# Patient Record
Sex: Male | Born: 1950 | Race: White | Hispanic: No | Marital: Married | Smoking: Former smoker
Health system: Southern US, Community
[De-identification: ages and names within clinical notes are randomized; demographics above are authoritative.]

## PROBLEM LIST (undated history)

## (undated) DIAGNOSIS — I10 Essential (primary) hypertension: Secondary | ICD-10-CM

## (undated) DIAGNOSIS — I4892 Unspecified atrial flutter: Secondary | ICD-10-CM

## (undated) HISTORY — PX: CYST EXCISION: SHX5701

## (undated) HISTORY — PX: TONSILLECTOMY: SUR1361

---

## 1997-12-24 ENCOUNTER — Ambulatory Visit (HOSPITAL_COMMUNITY): Admission: RE | Admit: 1997-12-24 | Discharge: 1997-12-24 | Payer: Self-pay | Admitting: Family Medicine

## 2006-09-18 ENCOUNTER — Encounter: Admission: RE | Admit: 2006-09-18 | Discharge: 2006-09-18 | Payer: Self-pay | Admitting: Family Medicine

## 2006-10-09 ENCOUNTER — Encounter: Admission: RE | Admit: 2006-10-09 | Discharge: 2006-10-09 | Payer: Self-pay | Admitting: Family Medicine

## 2009-06-07 IMAGING — CT CT CHEST W/ CM
2 of 4 series · 15 of 36 positions shown, 18 images · IV contrast (75CC OMNI 300)
Comparison: GI-[REDACTED] esophagram 09/18/06.

CLINICAL DATA: Dysphagia with posterior esophageal mass effect consistent with right aortic arch on GI-[REDACTED] esophagram, 09/18/06.
 CT CHEST WITH CONTRAST:
TECHNIQUE: Multidetector CT imaging of the chest was performed following the standard protocol during bolus administration of intravenous contrast.
 Contrast:  75 cc Omnipaque 300

[Series 3: routine chest · axial · 0.70mm/px · z∈[-317,-17]mm · 12 of 70 slices shown, 15 images]
[im 5/70  mediastinal]
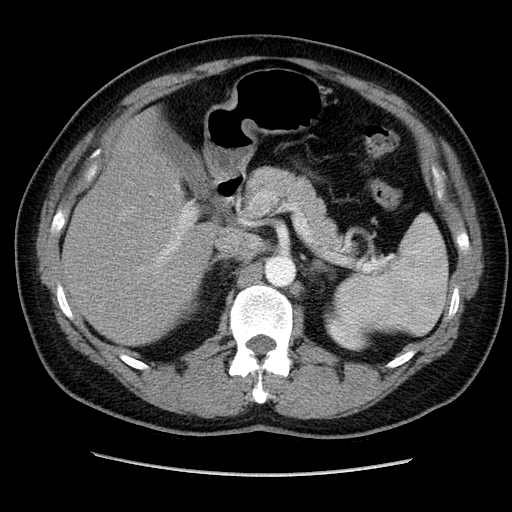
[im 5/70  lung]
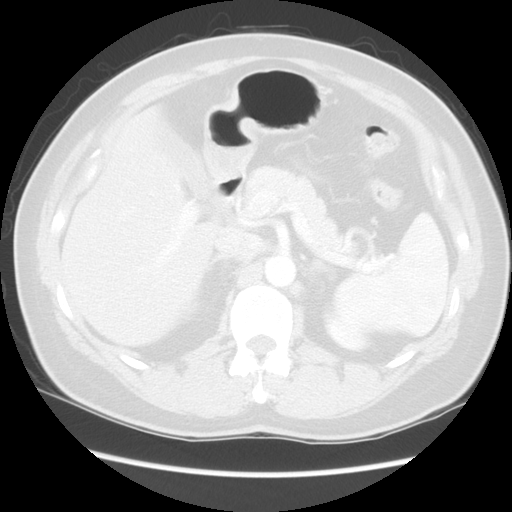
[im 10/70  lung]
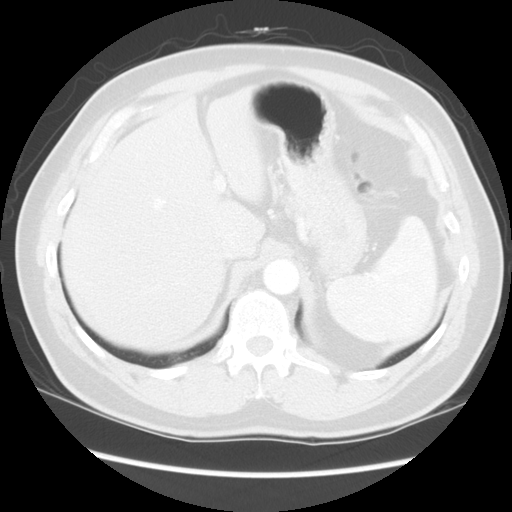
[im 14/70  lung]
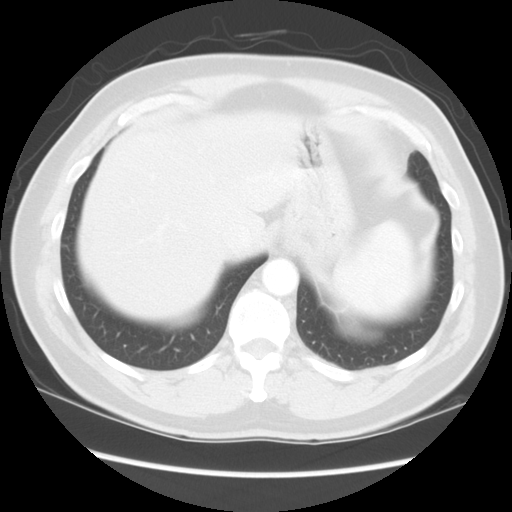
[im 24/70  lung]
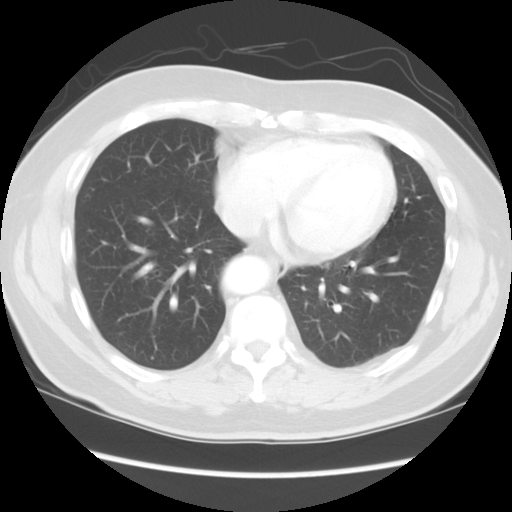
[im 28/70  mediastinal]
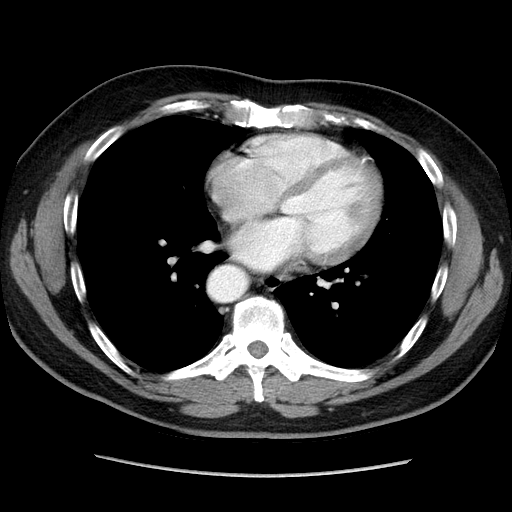
[im 28/70  lung]
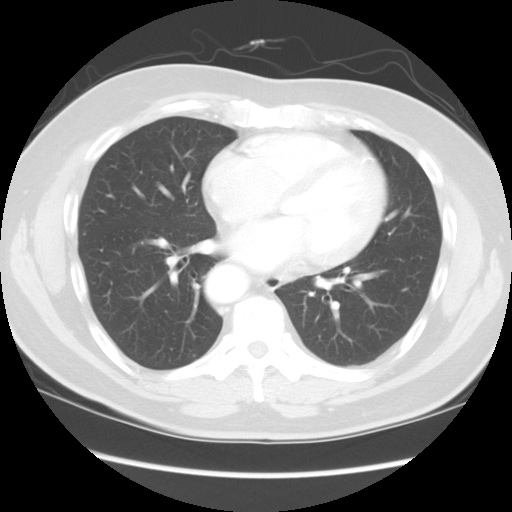
[im 33/70  lung]
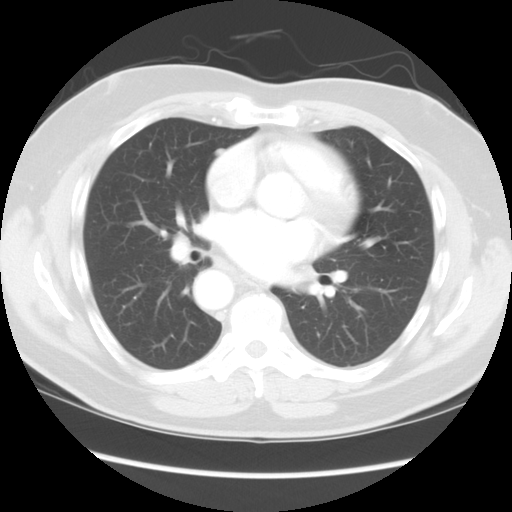
[im 37/70  lung]
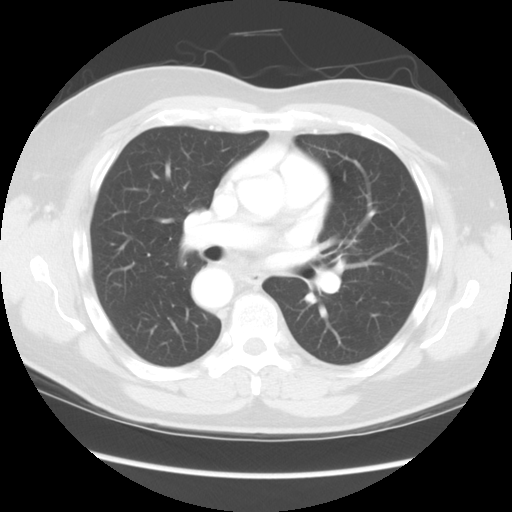
[im 42/70  lung]
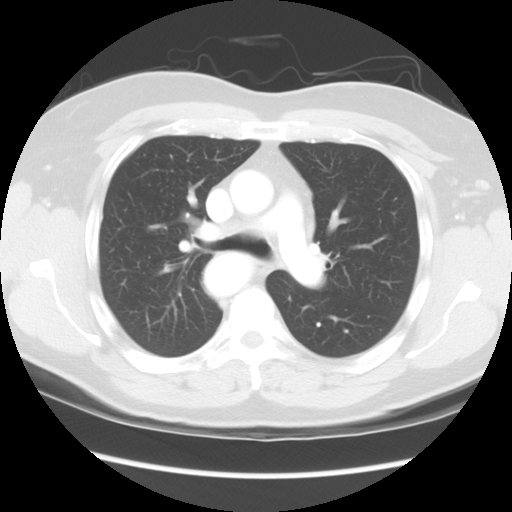
[im 47/70  mediastinal]
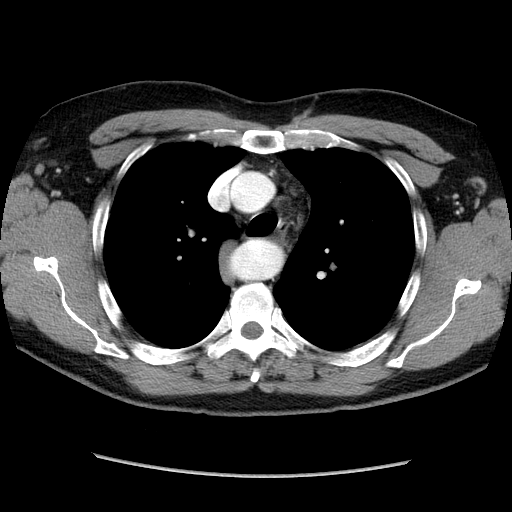
[im 47/70  lung]
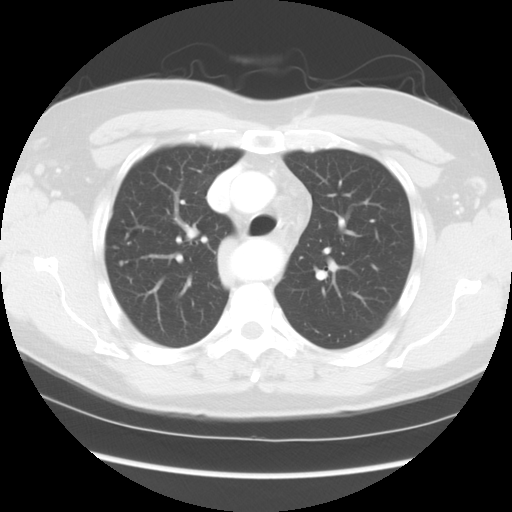
[im 56/70  lung]
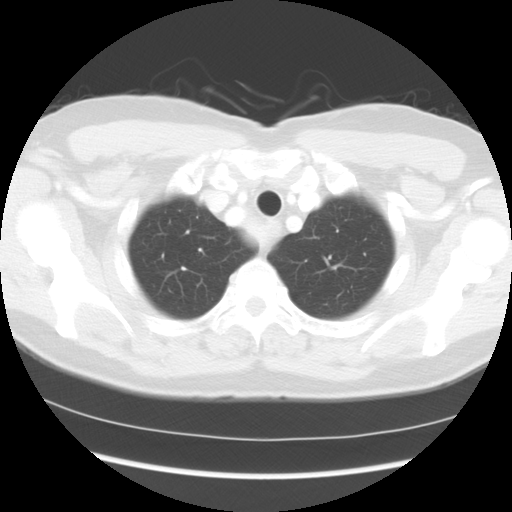
[im 60/70  lung]
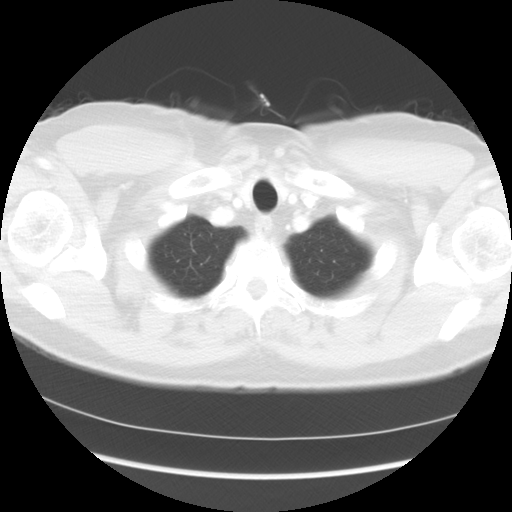
[im 65/70  lung]
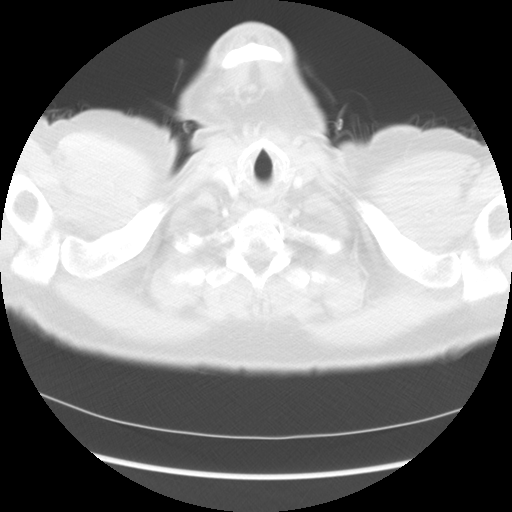

[Series 602: sagittal body · sagittal · 0.70mm/px · 3 of 145 slices shown]
[im 29/145  lung]
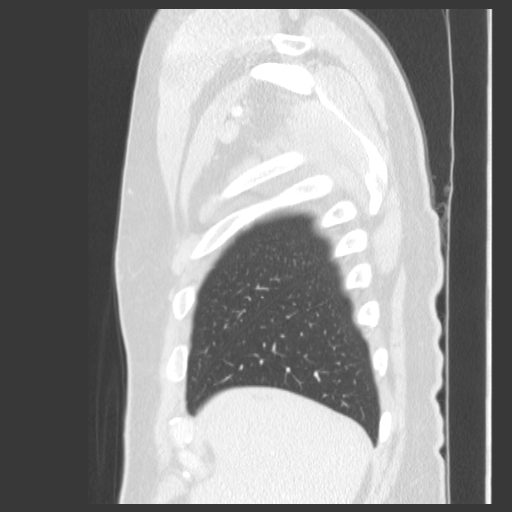
[im 58/145  lung]
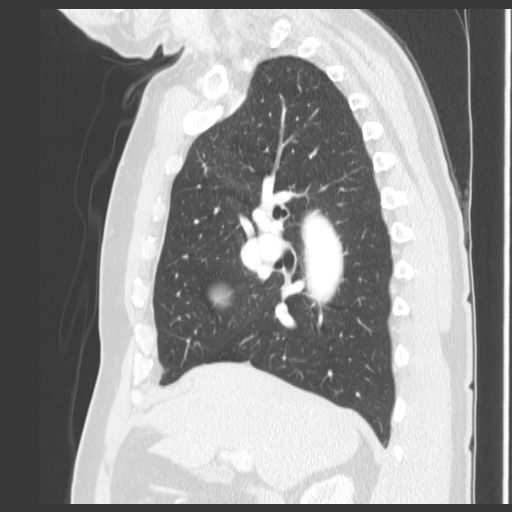
[im 87/145  lung]
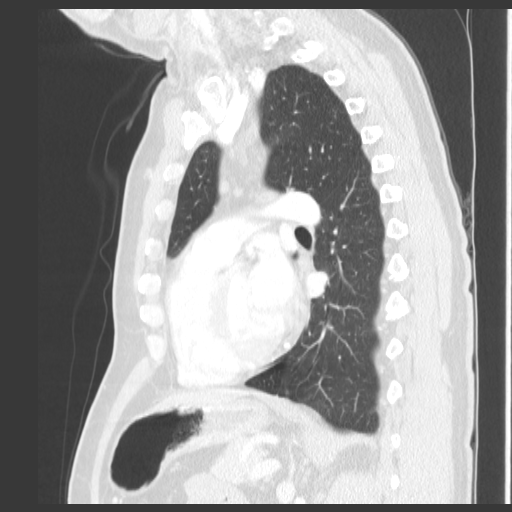

[15 of 36 positions shown; findings below may reference images not displayed]

FINDINGS: CT anomalous development demonstrates findings consistent with right aortic arch with mirror image branching - probable rare type II (rarely associated with congenital heart disease/cyanotic congenital heart disease).  The descending thoracic aorta initially is slightly to the left of midline, descends to the right and crosses at the hemidiaphragm to the left of midline.  Benign old calcified granulomatous right paratracheal lymph node and clustered subcentimeter right upper lobe calcified granulomata are noted.  No other mediastinal, hilar, nor axillary mass/adenopathy is seen with the lungs otherwise clear.  Heart size is normal.  Slight diffuse fatty infiltration of the liver is seen with the remaining abdominal organs and situs appearing normal.   No significant osseous abnormality is seen.
IMPRESSION: 1.  Findings consistent with type II right aortic arch with mirror image branching to confirm esophagram findings.
 2.  Old granulomatous disease.
 3.  Slight diffuse fatty infiltration of the liver. 
 4.  Otherwise negative.

## 2015-06-23 DIAGNOSIS — I10 Essential (primary) hypertension: Secondary | ICD-10-CM | POA: Diagnosis not present

## 2015-06-23 DIAGNOSIS — Z79899 Other long term (current) drug therapy: Secondary | ICD-10-CM | POA: Diagnosis not present

## 2015-06-23 DIAGNOSIS — E669 Obesity, unspecified: Secondary | ICD-10-CM | POA: Diagnosis not present

## 2015-06-23 DIAGNOSIS — Z6830 Body mass index (BMI) 30.0-30.9, adult: Secondary | ICD-10-CM | POA: Diagnosis not present

## 2015-06-30 DIAGNOSIS — R739 Hyperglycemia, unspecified: Secondary | ICD-10-CM | POA: Diagnosis not present

## 2015-08-19 DIAGNOSIS — H52203 Unspecified astigmatism, bilateral: Secondary | ICD-10-CM | POA: Diagnosis not present

## 2015-08-19 DIAGNOSIS — H524 Presbyopia: Secondary | ICD-10-CM | POA: Diagnosis not present

## 2015-09-29 DIAGNOSIS — H60312 Diffuse otitis externa, left ear: Secondary | ICD-10-CM | POA: Diagnosis not present

## 2015-10-21 DIAGNOSIS — L918 Other hypertrophic disorders of the skin: Secondary | ICD-10-CM | POA: Diagnosis not present

## 2015-10-21 DIAGNOSIS — L821 Other seborrheic keratosis: Secondary | ICD-10-CM | POA: Diagnosis not present

## 2015-10-21 DIAGNOSIS — D1801 Hemangioma of skin and subcutaneous tissue: Secondary | ICD-10-CM | POA: Diagnosis not present

## 2015-10-21 DIAGNOSIS — L570 Actinic keratosis: Secondary | ICD-10-CM | POA: Diagnosis not present

## 2015-10-21 DIAGNOSIS — D235 Other benign neoplasm of skin of trunk: Secondary | ICD-10-CM | POA: Diagnosis not present

## 2015-10-21 DIAGNOSIS — L814 Other melanin hyperpigmentation: Secondary | ICD-10-CM | POA: Diagnosis not present

## 2015-10-21 DIAGNOSIS — Z85828 Personal history of other malignant neoplasm of skin: Secondary | ICD-10-CM | POA: Diagnosis not present

## 2015-12-27 DIAGNOSIS — Z0000 Encounter for general adult medical examination without abnormal findings: Secondary | ICD-10-CM | POA: Diagnosis not present

## 2015-12-27 DIAGNOSIS — E7800 Pure hypercholesterolemia, unspecified: Secondary | ICD-10-CM | POA: Diagnosis not present

## 2015-12-27 DIAGNOSIS — E669 Obesity, unspecified: Secondary | ICD-10-CM | POA: Diagnosis not present

## 2015-12-27 DIAGNOSIS — Z6830 Body mass index (BMI) 30.0-30.9, adult: Secondary | ICD-10-CM | POA: Diagnosis not present

## 2015-12-27 DIAGNOSIS — I10 Essential (primary) hypertension: Secondary | ICD-10-CM | POA: Diagnosis not present

## 2015-12-27 DIAGNOSIS — Z1389 Encounter for screening for other disorder: Secondary | ICD-10-CM | POA: Diagnosis not present

## 2015-12-27 DIAGNOSIS — Z23 Encounter for immunization: Secondary | ICD-10-CM | POA: Diagnosis not present

## 2015-12-27 DIAGNOSIS — Z79899 Other long term (current) drug therapy: Secondary | ICD-10-CM | POA: Diagnosis not present

## 2015-12-30 DIAGNOSIS — Z125 Encounter for screening for malignant neoplasm of prostate: Secondary | ICD-10-CM | POA: Diagnosis not present

## 2015-12-30 DIAGNOSIS — Z79899 Other long term (current) drug therapy: Secondary | ICD-10-CM | POA: Diagnosis not present

## 2015-12-30 DIAGNOSIS — I10 Essential (primary) hypertension: Secondary | ICD-10-CM | POA: Diagnosis not present

## 2015-12-30 DIAGNOSIS — E7800 Pure hypercholesterolemia, unspecified: Secondary | ICD-10-CM | POA: Diagnosis not present

## 2016-05-31 DIAGNOSIS — J301 Allergic rhinitis due to pollen: Secondary | ICD-10-CM | POA: Diagnosis not present

## 2016-05-31 DIAGNOSIS — I10 Essential (primary) hypertension: Secondary | ICD-10-CM | POA: Diagnosis not present

## 2016-06-30 DIAGNOSIS — H60312 Diffuse otitis externa, left ear: Secondary | ICD-10-CM | POA: Diagnosis not present

## 2016-07-27 DIAGNOSIS — H6123 Impacted cerumen, bilateral: Secondary | ICD-10-CM | POA: Diagnosis not present

## 2016-07-27 DIAGNOSIS — H60332 Swimmer's ear, left ear: Secondary | ICD-10-CM | POA: Diagnosis not present

## 2016-12-27 DIAGNOSIS — E7800 Pure hypercholesterolemia, unspecified: Secondary | ICD-10-CM | POA: Diagnosis not present

## 2016-12-27 DIAGNOSIS — Z6831 Body mass index (BMI) 31.0-31.9, adult: Secondary | ICD-10-CM | POA: Diagnosis not present

## 2016-12-27 DIAGNOSIS — Z79899 Other long term (current) drug therapy: Secondary | ICD-10-CM | POA: Diagnosis not present

## 2016-12-27 DIAGNOSIS — E669 Obesity, unspecified: Secondary | ICD-10-CM | POA: Diagnosis not present

## 2016-12-27 DIAGNOSIS — I10 Essential (primary) hypertension: Secondary | ICD-10-CM | POA: Diagnosis not present

## 2016-12-27 DIAGNOSIS — Z1331 Encounter for screening for depression: Secondary | ICD-10-CM | POA: Diagnosis not present

## 2016-12-27 DIAGNOSIS — Z0000 Encounter for general adult medical examination without abnormal findings: Secondary | ICD-10-CM | POA: Diagnosis not present

## 2016-12-29 DIAGNOSIS — Z79899 Other long term (current) drug therapy: Secondary | ICD-10-CM | POA: Diagnosis not present

## 2016-12-29 DIAGNOSIS — Z125 Encounter for screening for malignant neoplasm of prostate: Secondary | ICD-10-CM | POA: Diagnosis not present

## 2016-12-29 DIAGNOSIS — E7800 Pure hypercholesterolemia, unspecified: Secondary | ICD-10-CM | POA: Diagnosis not present

## 2016-12-29 DIAGNOSIS — Z0000 Encounter for general adult medical examination without abnormal findings: Secondary | ICD-10-CM | POA: Diagnosis not present

## 2017-02-26 DIAGNOSIS — I10 Essential (primary) hypertension: Secondary | ICD-10-CM | POA: Diagnosis not present

## 2017-05-29 DIAGNOSIS — Z79899 Other long term (current) drug therapy: Secondary | ICD-10-CM | POA: Diagnosis not present

## 2017-05-29 DIAGNOSIS — I10 Essential (primary) hypertension: Secondary | ICD-10-CM | POA: Diagnosis not present

## 2017-06-20 DIAGNOSIS — L821 Other seborrheic keratosis: Secondary | ICD-10-CM | POA: Diagnosis not present

## 2017-06-20 DIAGNOSIS — D229 Melanocytic nevi, unspecified: Secondary | ICD-10-CM | POA: Diagnosis not present

## 2017-06-20 DIAGNOSIS — Z85828 Personal history of other malignant neoplasm of skin: Secondary | ICD-10-CM | POA: Diagnosis not present

## 2017-06-20 DIAGNOSIS — D1801 Hemangioma of skin and subcutaneous tissue: Secondary | ICD-10-CM | POA: Diagnosis not present

## 2017-06-20 DIAGNOSIS — L570 Actinic keratosis: Secondary | ICD-10-CM | POA: Diagnosis not present

## 2017-06-20 DIAGNOSIS — L819 Disorder of pigmentation, unspecified: Secondary | ICD-10-CM | POA: Diagnosis not present

## 2018-01-01 DIAGNOSIS — E7800 Pure hypercholesterolemia, unspecified: Secondary | ICD-10-CM | POA: Diagnosis not present

## 2018-01-01 DIAGNOSIS — I10 Essential (primary) hypertension: Secondary | ICD-10-CM | POA: Diagnosis not present

## 2018-01-01 DIAGNOSIS — Z1389 Encounter for screening for other disorder: Secondary | ICD-10-CM | POA: Diagnosis not present

## 2018-01-01 DIAGNOSIS — Z79899 Other long term (current) drug therapy: Secondary | ICD-10-CM | POA: Diagnosis not present

## 2018-01-01 DIAGNOSIS — Z0000 Encounter for general adult medical examination without abnormal findings: Secondary | ICD-10-CM | POA: Diagnosis not present

## 2018-01-02 DIAGNOSIS — Z0000 Encounter for general adult medical examination without abnormal findings: Secondary | ICD-10-CM | POA: Diagnosis not present

## 2018-01-02 DIAGNOSIS — Z125 Encounter for screening for malignant neoplasm of prostate: Secondary | ICD-10-CM | POA: Diagnosis not present

## 2018-01-02 DIAGNOSIS — E7800 Pure hypercholesterolemia, unspecified: Secondary | ICD-10-CM | POA: Diagnosis not present

## 2018-01-02 DIAGNOSIS — Z79899 Other long term (current) drug therapy: Secondary | ICD-10-CM | POA: Diagnosis not present

## 2018-01-02 DIAGNOSIS — I10 Essential (primary) hypertension: Secondary | ICD-10-CM | POA: Diagnosis not present

## 2018-04-08 ENCOUNTER — Other Ambulatory Visit: Payer: Self-pay

## 2018-04-08 ENCOUNTER — Other Ambulatory Visit: Payer: Self-pay | Admitting: Geriatric Medicine

## 2018-04-08 ENCOUNTER — Ambulatory Visit
Admission: RE | Admit: 2018-04-08 | Discharge: 2018-04-08 | Disposition: A | Discharge status: Home / Self Care | Payer: PPO | Source: Ambulatory Visit | Attending: Geriatric Medicine | Admitting: Geriatric Medicine

## 2018-04-08 DIAGNOSIS — M79644 Pain in right finger(s): Secondary | ICD-10-CM

## 2018-04-08 DIAGNOSIS — M1811 Unilateral primary osteoarthritis of first carpometacarpal joint, right hand: Secondary | ICD-10-CM | POA: Diagnosis not present

## 2018-07-08 DIAGNOSIS — I10 Essential (primary) hypertension: Secondary | ICD-10-CM | POA: Diagnosis not present

## 2018-07-08 DIAGNOSIS — Z79899 Other long term (current) drug therapy: Secondary | ICD-10-CM | POA: Diagnosis not present

## 2018-07-11 DIAGNOSIS — Z79899 Other long term (current) drug therapy: Secondary | ICD-10-CM | POA: Diagnosis not present

## 2018-10-18 DIAGNOSIS — H6123 Impacted cerumen, bilateral: Secondary | ICD-10-CM | POA: Diagnosis not present

## 2018-10-18 DIAGNOSIS — H60333 Swimmer's ear, bilateral: Secondary | ICD-10-CM | POA: Diagnosis not present

## 2018-12-03 DIAGNOSIS — K648 Other hemorrhoids: Secondary | ICD-10-CM | POA: Diagnosis not present

## 2018-12-03 DIAGNOSIS — D122 Benign neoplasm of ascending colon: Secondary | ICD-10-CM | POA: Diagnosis not present

## 2018-12-03 DIAGNOSIS — K635 Polyp of colon: Secondary | ICD-10-CM | POA: Diagnosis not present

## 2018-12-03 DIAGNOSIS — D123 Benign neoplasm of transverse colon: Secondary | ICD-10-CM | POA: Diagnosis not present

## 2018-12-03 DIAGNOSIS — Z9189 Other specified personal risk factors, not elsewhere classified: Secondary | ICD-10-CM | POA: Diagnosis not present

## 2018-12-03 DIAGNOSIS — Z1211 Encounter for screening for malignant neoplasm of colon: Secondary | ICD-10-CM | POA: Diagnosis not present

## 2019-01-08 DIAGNOSIS — Z79899 Other long term (current) drug therapy: Secondary | ICD-10-CM | POA: Diagnosis not present

## 2019-01-08 DIAGNOSIS — R21 Rash and other nonspecific skin eruption: Secondary | ICD-10-CM | POA: Diagnosis not present

## 2019-01-08 DIAGNOSIS — Z0000 Encounter for general adult medical examination without abnormal findings: Secondary | ICD-10-CM | POA: Diagnosis not present

## 2019-01-08 DIAGNOSIS — Z1389 Encounter for screening for other disorder: Secondary | ICD-10-CM | POA: Diagnosis not present

## 2019-01-08 DIAGNOSIS — I10 Essential (primary) hypertension: Secondary | ICD-10-CM | POA: Diagnosis not present

## 2019-01-08 DIAGNOSIS — E7800 Pure hypercholesterolemia, unspecified: Secondary | ICD-10-CM | POA: Diagnosis not present

## 2019-02-04 DIAGNOSIS — Z79899 Other long term (current) drug therapy: Secondary | ICD-10-CM | POA: Diagnosis not present

## 2019-03-17 ENCOUNTER — Ambulatory Visit: Payer: PPO

## 2019-05-21 DIAGNOSIS — H5203 Hypermetropia, bilateral: Secondary | ICD-10-CM | POA: Diagnosis not present

## 2019-05-21 DIAGNOSIS — H2513 Age-related nuclear cataract, bilateral: Secondary | ICD-10-CM | POA: Diagnosis not present

## 2019-06-25 DIAGNOSIS — I10 Essential (primary) hypertension: Secondary | ICD-10-CM | POA: Diagnosis not present

## 2019-06-25 DIAGNOSIS — Z79899 Other long term (current) drug therapy: Secondary | ICD-10-CM | POA: Diagnosis not present

## 2019-07-10 DIAGNOSIS — Z7184 Encounter for health counseling related to travel: Secondary | ICD-10-CM | POA: Diagnosis not present

## 2019-07-10 DIAGNOSIS — Z03818 Encounter for observation for suspected exposure to other biological agents ruled out: Secondary | ICD-10-CM | POA: Diagnosis not present

## 2019-07-12 DIAGNOSIS — Z20822 Contact with and (suspected) exposure to covid-19: Secondary | ICD-10-CM | POA: Diagnosis not present

## 2019-09-27 DIAGNOSIS — Z20822 Contact with and (suspected) exposure to covid-19: Secondary | ICD-10-CM | POA: Diagnosis not present

## 2020-02-02 DIAGNOSIS — Z0000 Encounter for general adult medical examination without abnormal findings: Secondary | ICD-10-CM | POA: Diagnosis not present

## 2020-02-02 DIAGNOSIS — Z1389 Encounter for screening for other disorder: Secondary | ICD-10-CM | POA: Diagnosis not present

## 2020-02-02 DIAGNOSIS — I10 Essential (primary) hypertension: Secondary | ICD-10-CM | POA: Diagnosis not present

## 2020-02-02 DIAGNOSIS — Z79899 Other long term (current) drug therapy: Secondary | ICD-10-CM | POA: Diagnosis not present

## 2020-02-02 DIAGNOSIS — E7800 Pure hypercholesterolemia, unspecified: Secondary | ICD-10-CM | POA: Diagnosis not present

## 2020-02-02 DIAGNOSIS — E222 Syndrome of inappropriate secretion of antidiuretic hormone: Secondary | ICD-10-CM | POA: Diagnosis not present

## 2020-04-09 DIAGNOSIS — R350 Frequency of micturition: Secondary | ICD-10-CM | POA: Diagnosis not present

## 2020-04-09 DIAGNOSIS — I10 Essential (primary) hypertension: Secondary | ICD-10-CM | POA: Diagnosis not present

## 2020-04-26 DIAGNOSIS — L570 Actinic keratosis: Secondary | ICD-10-CM | POA: Diagnosis not present

## 2020-04-26 DIAGNOSIS — L821 Other seborrheic keratosis: Secondary | ICD-10-CM | POA: Diagnosis not present

## 2020-04-26 DIAGNOSIS — L239 Allergic contact dermatitis, unspecified cause: Secondary | ICD-10-CM | POA: Diagnosis not present

## 2020-05-20 DIAGNOSIS — Z20822 Contact with and (suspected) exposure to covid-19: Secondary | ICD-10-CM | POA: Diagnosis not present

## 2020-06-10 DIAGNOSIS — L570 Actinic keratosis: Secondary | ICD-10-CM | POA: Diagnosis not present

## 2020-06-10 DIAGNOSIS — L578 Other skin changes due to chronic exposure to nonionizing radiation: Secondary | ICD-10-CM | POA: Diagnosis not present

## 2020-07-25 DIAGNOSIS — Z20822 Contact with and (suspected) exposure to covid-19: Secondary | ICD-10-CM | POA: Diagnosis not present

## 2020-11-08 DIAGNOSIS — Z23 Encounter for immunization: Secondary | ICD-10-CM | POA: Diagnosis not present

## 2020-11-08 DIAGNOSIS — R0602 Shortness of breath: Secondary | ICD-10-CM | POA: Diagnosis not present

## 2020-11-08 DIAGNOSIS — R000 Tachycardia, unspecified: Secondary | ICD-10-CM | POA: Diagnosis not present

## 2020-11-08 DIAGNOSIS — R052 Subacute cough: Secondary | ICD-10-CM | POA: Diagnosis not present

## 2020-11-08 DIAGNOSIS — I483 Typical atrial flutter: Secondary | ICD-10-CM | POA: Diagnosis not present

## 2020-11-09 ENCOUNTER — Telehealth: Payer: Self-pay

## 2020-11-09 NOTE — Telephone Encounter (Signed)
NOTES SCANNED TO REFERRAL 

## 2020-11-12 ENCOUNTER — Ambulatory Visit (HOSPITAL_COMMUNITY)
Admission: RE | Admit: 2020-11-12 | Discharge: 2020-11-12 | Disposition: A | Discharge status: Home / Self Care | Payer: PPO | Source: Ambulatory Visit | Attending: Physician Assistant | Admitting: Physician Assistant

## 2020-11-12 ENCOUNTER — Encounter (HOSPITAL_COMMUNITY): Payer: Self-pay | Admitting: Physician Assistant

## 2020-11-12 ENCOUNTER — Other Ambulatory Visit: Payer: Self-pay

## 2020-11-12 VITALS — BP 132/88 | HR 99 | Ht 66.0 in | Wt 190.2 lb

## 2020-11-12 DIAGNOSIS — Z7901 Long term (current) use of anticoagulants: Secondary | ICD-10-CM | POA: Diagnosis not present

## 2020-11-12 DIAGNOSIS — Z8616 Personal history of COVID-19: Secondary | ICD-10-CM | POA: Diagnosis not present

## 2020-11-12 DIAGNOSIS — Z79899 Other long term (current) drug therapy: Secondary | ICD-10-CM | POA: Insufficient documentation

## 2020-11-12 DIAGNOSIS — R0600 Dyspnea, unspecified: Secondary | ICD-10-CM | POA: Diagnosis not present

## 2020-11-12 DIAGNOSIS — I484 Atypical atrial flutter: Secondary | ICD-10-CM | POA: Diagnosis not present

## 2020-11-12 DIAGNOSIS — E669 Obesity, unspecified: Secondary | ICD-10-CM | POA: Insufficient documentation

## 2020-11-12 DIAGNOSIS — I10 Essential (primary) hypertension: Secondary | ICD-10-CM | POA: Insufficient documentation

## 2020-11-12 DIAGNOSIS — Z6830 Body mass index (BMI) 30.0-30.9, adult: Secondary | ICD-10-CM | POA: Insufficient documentation

## 2020-11-12 DIAGNOSIS — I4891 Unspecified atrial fibrillation: Secondary | ICD-10-CM | POA: Diagnosis not present

## 2020-11-12 DIAGNOSIS — Z87891 Personal history of nicotine dependence: Secondary | ICD-10-CM | POA: Insufficient documentation

## 2020-11-12 DIAGNOSIS — Z888 Allergy status to other drugs, medicaments and biological substances status: Secondary | ICD-10-CM | POA: Insufficient documentation

## 2020-11-12 DIAGNOSIS — I4892 Unspecified atrial flutter: Secondary | ICD-10-CM | POA: Insufficient documentation

## 2020-11-12 DIAGNOSIS — D6869 Other thrombophilia: Secondary | ICD-10-CM | POA: Diagnosis not present

## 2020-11-12 HISTORY — DX: Essential (primary) hypertension: I10

## 2020-11-12 NOTE — Patient Instructions (Signed)
Cardioversion scheduled for Tuesday, November 8th  - Come to afib clinic for labs at [place] at the [facility] and go to admitting at [place] not eat or drink anything after midnight the night prior to your procedure.  - Take all your morning medication (except diabetic medications) with a sip of water prior to arrival.  - You will not be able to drive home after your procedure.  - Do NOT miss any doses of your blood thinner - if you should miss a dose please notify our office immediately.  - If you feel as if you go back into normal rhythm prior to scheduled cardioversion, please notify our office immediately. If your procedure is canceled in the cardioversion suite you will be charged a cancellation fee. Patients will be asked to: to mask in public and hand hygiene (no longer quarantine) in the 3 days prior to surgery, to report if any COVID-19-like illness or household contacts to COVID-19 to determine need for testing

## 2020-11-12 NOTE — Progress Notes (Signed)
Primary Care Physician: [name], MD Primary Cardiologist: none Primary Electrophysiologist: none Referring Physician: Dr Trula Ore is a 70 y.o. male with a history of HTN, HLD, and atrial flutter who presents for consultation in the [facility].  The patient was initially diagnosed with atrial flutter on 11/08/20 after presenting to his PCP with symptoms of increased dyspnea on exertion. He had COVID-19 in 08/2018 and has felt this dyspnea since. ECG at his PCP showed atrial flutter. Patient was started on Eliquis for a CHADS2VASC score of 2. He denies significant snoring or alcohol use.   Today, he denies symptoms of palpitations, chest pain, orthopnea, PND, lower extremity edema, dizziness, presyncope, syncope, snoring, daytime somnolence, bleeding, or neurologic sequela. The patient is tolerating medications without difficulties and is otherwise without complaint today.    Atrial Fibrillation Risk Factors:  he does not have symptoms or diagnosis of sleep apnea. he does not have a history of rheumatic fever. he does not have a history of alcohol use. The patient does not have a history of early familial atrial fibrillation or other arrhythmias.  he has a BMI of Body mass index is 30.7 kg/m.Marland Kitchen Filed Weights   11/12/20 0836  Weight: 86.3 kg    History reviewed. No pertinent family history.   Atrial Fibrillation Management history:  Previous antiarrhythmic drugs: none Previous cardioversions: none Previous ablations: none CHADS2VASC score: 2 Anticoagulation history: Eliquis   Past Medical History:  Diagnosis Date   Hypertension    History reviewed. No pertinent surgical history.  Current Outpatient Medications  Medication Sig Dispense Refill   Cholecalciferol (VITAMIN D HIGH POTENCY) 25 MCG (1000 UT) capsule Take 1,000 Units by mouth daily.     co-enzyme Q-10 30 MG capsule Take 100 mg by mouth daily.     ELIQUIS 5  MG TABS tablet Take 5 mg by mouth 2 (two) times daily.     losartan (COZAAR) 100 MG tablet Take 100 mg by mouth daily.     metoprolol succinate (TOPROL-XL) 50 MG 24 hr tablet Take 75 mg by mouth daily.     Omega-3 Fatty Acids (FISH OIL) 1000 MG CAPS Take by mouth.     pravastatin (PRAVACHOL) 40 MG tablet Take 40 mg by mouth daily.     tamsulosin (FLOMAX) 0.4 MG CAPS capsule Take 0.4 mg by mouth daily.     No current facility-administered medications for this encounter.    Allergies  Allergen Reactions   Lisinopril Cough   Statins Other (See Comments)    Muscle pain    Social History   Socioeconomic History   Marital status: Married    Spouse name: Not on file   Number of children: Not on file   Years of education: Not on file   Highest education level: Not on file  Occupational History   Not on file  Tobacco Use   Smoking status: Former    Types: Cigarettes    Quit date: 38    Years since quitting: 43.8   Smokeless tobacco: Never   Tobacco comments:    Former smoker 11/12/2020  Substance and Sexual Activity   Alcohol use: Yes    Alcohol/week: 14.0 - 18.0 standard drinks    Types: 14 - 18 Glasses of wine per week    Comment: 2-3 glasses of wine daily   Drug use: Never   Sexual activity: Not on file  Other Topics Concern   Not on file  Social History Narrative   Not on file   Social Determinants of Health   Financial Resource Strain: Not on file  Food Insecurity: Not on file  Transportation Needs: Not on file  Physical Activity: Not on file  Stress: Not on file  Social Connections: Not on file  Intimate Partner Violence: Not on file     ROS- All systems are reviewed and negative except as per the HPI above.  Physical Exam: Vitals:   11/12/20 0836  BP: 132/88  Pulse: 99  Weight: 86.3 kg  Height: 5\' 6"  (1.676 m)    GEN- The patient is a well appearing obese male, alert and oriented x 3 today.   Head- normocephalic, atraumatic Eyes-  Sclera clear,  conjunctiva pink Ears- hearing intact Oropharynx- clear Neck- supple  Lungs- Clear to ausculation bilaterally, normal work of breathing Heart- irregular rate and rhythm, no murmurs, rubs or gallops  GI- soft, NT, ND, + BS Extremities- no clubbing, cyanosis, or edema MS- no significant deformity or atrophy Skin- no rash or lesion Psych- euthymic mood, full affect Neuro- strength and sensation are intact  Wt Readings from Last 3 Encounters:  11/12/20 86.3 kg    EKG today demonstrates  Atypical atrial flutter Vent. rate 99 BPM PR interval * ms QRS duration 74 ms QT/QTcB 306/392 ms  Epic records are reviewed at length today  CHA2DS2-VASc Score = 2  The patient's score is based upon: CHF History: 0 HTN History: 1 Diabetes History: 0 Stroke History: 0 Vascular Disease History: 0 Age Score: 1 Gender Score: 0      ASSESSMENT AND PLAN: 1. Atypical atrial flutter The patient's CHA2DS2-VASc score is 2, indicating a 2.2% annual risk of stroke. General education about atrial flutter provided and questions answered. We also discussed his stroke risk and the risks and benefits of anticoagulation. ? If related to recent COVID infection.  Will arrange for DCCV after 3 weeks of uninterrupted anticoagulation. Check echocardiogram Continue Eliquis 5 mg BID Continue Toprol 75 mg daily  2. Secondary Hypercoagulable State (ICD10:  D68.69) The patient is at significant risk for stroke/thromboembolism based upon his CHA2DS2-VASc Score of 2.  Continue Apixaban (Eliquis).   3. Obesity Body mass index is 30.7 kg/m. Lifestyle modification was discussed at length including regular exercise and weight reduction.  4. HTN Stable, no changes today.   Follow up in the AF clinic post DCCV.    [facility] [address] [place], [place] [address] [phone] 11/12/2020 12:07 PM

## 2020-11-15 ENCOUNTER — Ambulatory Visit (HOSPITAL_COMMUNITY)
Admission: RE | Admit: 2020-11-15 | Discharge: 2020-11-15 | Disposition: A | Discharge status: Home / Self Care | Payer: PPO | Source: Ambulatory Visit | Attending: Physician Assistant | Admitting: Physician Assistant

## 2020-11-15 ENCOUNTER — Other Ambulatory Visit: Payer: Self-pay

## 2020-11-15 ENCOUNTER — Other Ambulatory Visit (HOSPITAL_COMMUNITY): Payer: Self-pay | Admitting: *Deleted

## 2020-11-15 DIAGNOSIS — I484 Atypical atrial flutter: Secondary | ICD-10-CM | POA: Diagnosis not present

## 2020-11-15 LAB — ECHOCARDIOGRAM COMPLETE: S' Lateral: 2.9 cm

## 2020-11-17 ENCOUNTER — Encounter (HOSPITAL_COMMUNITY): Payer: Self-pay | Admitting: *Deleted

## 2020-11-26 NOTE — Progress Notes (Signed)
Attempted to obtain medical history via telephone, unable to reach at this time. Unable to leave voicemail to return pre surgical testing department's phone call.   ?

## 2020-11-30 ENCOUNTER — Encounter (HOSPITAL_COMMUNITY): Payer: PPO | Admitting: Physician Assistant

## 2020-12-05 IMAGING — CR RIGHT THUMB 2+V
3 series · 3 of 3 positions shown · non-contrast
Comparison: None.

CLINICAL DATA: Right thumb pain at the MCP joint. Possible injury
while gardening.

EXAM:
RIGHT THUMB 2+V

[x finger pa right]
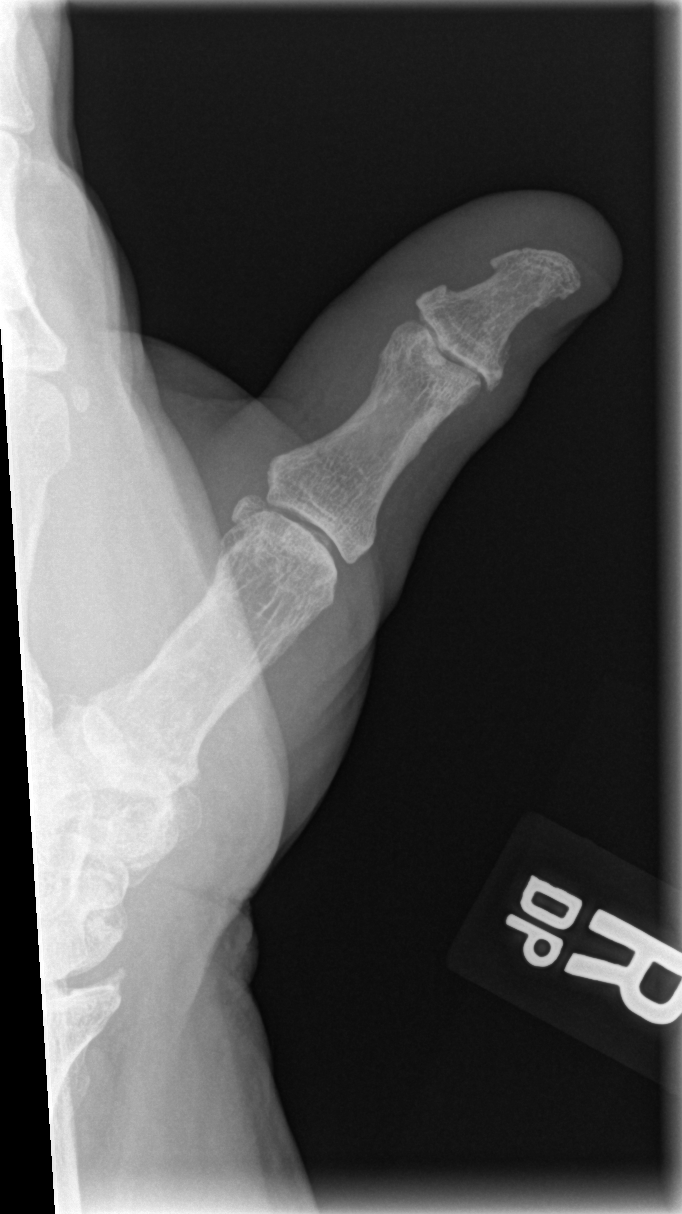

[x finger obl. right]
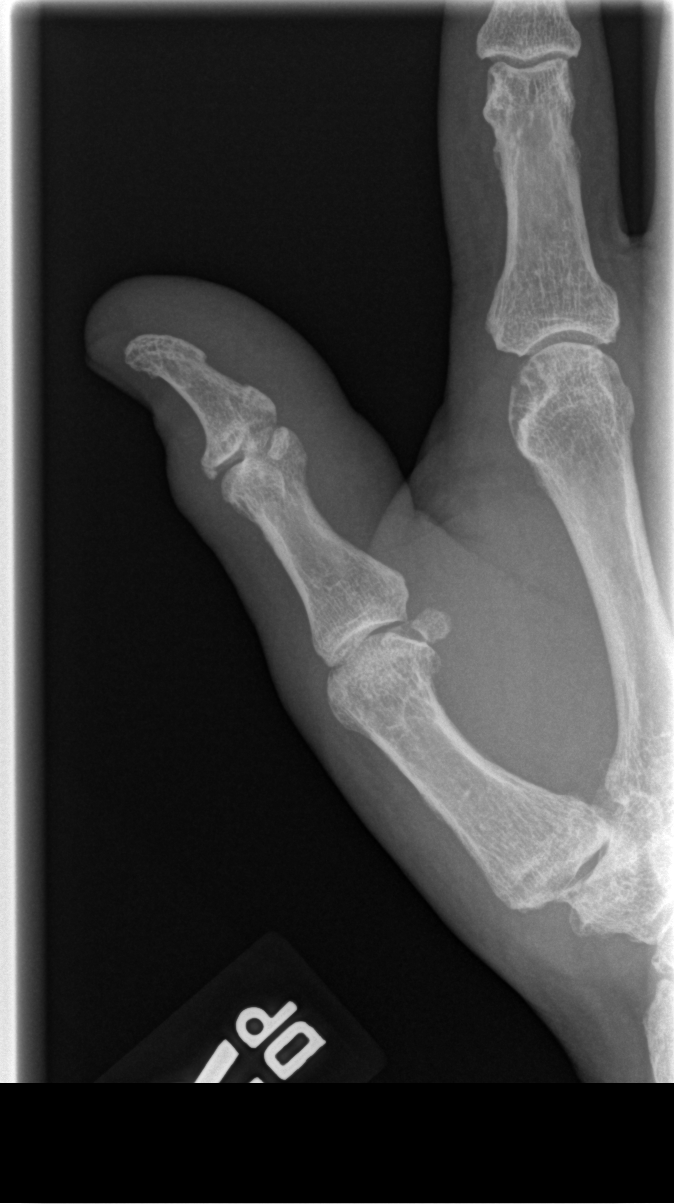

[x finger lateral right]
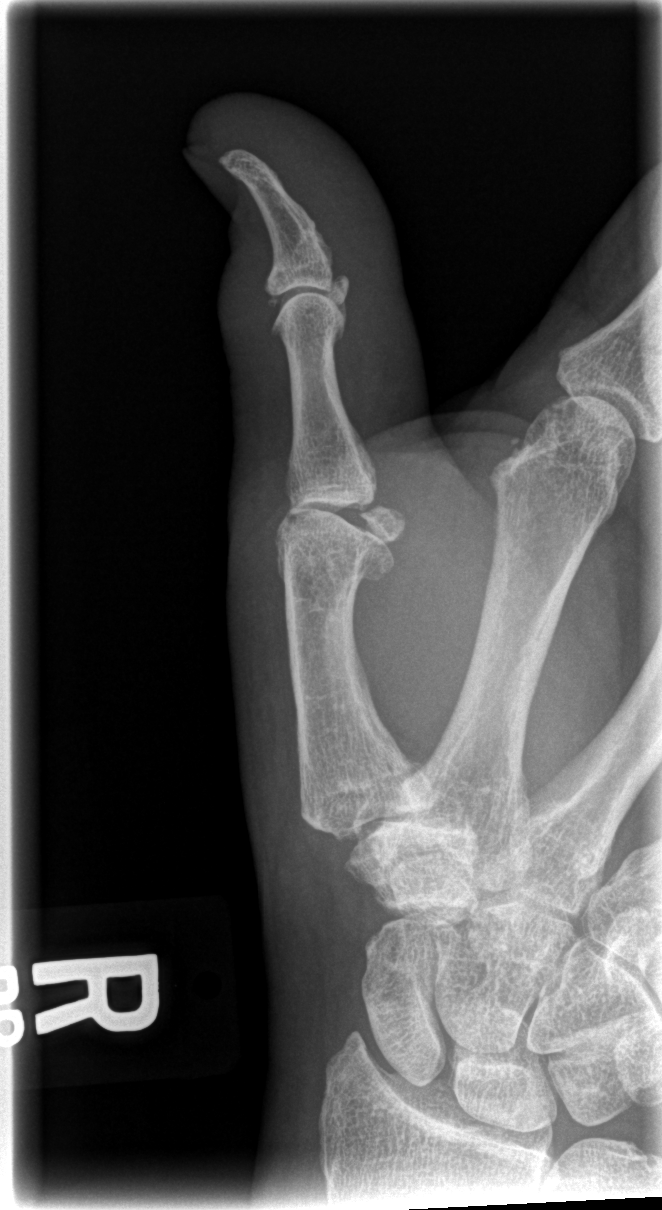

[3 of 3 positions shown; findings below may reference images not displayed]

FINDINGS: There is mild widening of the palmar aspect of the first MCP joint
with hypertrophic changes of the head of the first metacarpal. No
dislocation or acute fracture is identified. Degenerative spurring
is also noted at the thumb IP and first CMC joints. No focal soft
tissue abnormality is seen.
IMPRESSION: 1. No acute osseous abnormality identified.
2. Degenerative changes as above.

## 2020-12-06 ENCOUNTER — Encounter (HOSPITAL_COMMUNITY): Payer: PPO | Admitting: Physician Assistant

## 2020-12-07 ENCOUNTER — Ambulatory Visit (HOSPITAL_COMMUNITY): Payer: PPO | Admitting: Physician Assistant

## 2020-12-07 ENCOUNTER — Ambulatory Visit (HOSPITAL_COMMUNITY)
Admission: RE | Admit: 2020-12-07 | Discharge: 2020-12-07 | Disposition: A | Discharge status: Home / Self Care | Payer: PPO | Source: Ambulatory Visit | Attending: Physician Assistant | Admitting: Physician Assistant

## 2020-12-07 DIAGNOSIS — I484 Atypical atrial flutter: Secondary | ICD-10-CM | POA: Insufficient documentation

## 2020-12-07 LAB — BASIC METABOLIC PANEL
Anion gap: 8 (ref 5–15)
BUN: 12 mg/dL (ref 8–23)
CO2: 22 mmol/L (ref 22–32)
Calcium: 9.1 mg/dL (ref 8.9–10.3)
Chloride: 101 mmol/L (ref 98–111)
Creatinine, Ser: 0.92 mg/dL (ref 0.61–1.24)
GFR, Estimated: >60 mL/min (ref >60)
Glucose, Bld: 103 mg/dL — ABNORMAL HIGH (ref 70–99)
Potassium: 4.4 mmol/L (ref 3.5–5.1)
Sodium: 131 mmol/L — ABNORMAL LOW (ref 135–145)

## 2020-12-07 LAB — CBC
HCT: 47.1 % (ref 39.0–52.0)
Hemoglobin: 16.3 g/dL (ref 13.0–17.0)
MCH: 31.4 pg (ref 26.0–34.0)
MCHC: 34.6 g/dL (ref 30.0–36.0)
MCV: 90.8 fL (ref 80.0–100.0)
Platelets: 171 10*3/uL (ref 150–400)
RBC: 5.19 MIL/uL (ref 4.22–5.81)
RDW: 12.6 % (ref 11.5–15.5)
WBC: 6.5 10*3/uL (ref 4.0–10.5)
nRBC: 0 % (ref 0.0–0.2)

## 2020-12-07 NOTE — Progress Notes (Signed)
Patient presents for ECG and labs prior to DCCV. ECG shows rate controlled atrial flutter with variable block HR 72, QRS 72, QTc 435. Unfortunately, the patient ate breakfast today. Will reschedule DCCV.

## 2020-12-07 NOTE — Patient Instructions (Signed)
Cardioversion scheduled for Monday, November 21st  - Arrive at the [facility] and go to admitting at 730AM  - Do not eat or drink anything after midnight the night prior to your procedure.  - Take all your morning medication (except diabetic medications) with a sip of water prior to arrival.  - You will not be able to drive home after your procedure.  - Do NOT miss any doses of your blood thinner - if you should miss a dose please notify our office immediately.  - If you feel as if you go back into normal rhythm prior to scheduled cardioversion, please notify our office immediately. If your procedure is canceled in the cardioversion suite you will be charged a cancellation fee.  Patients will be asked to: to mask in public and hand hygiene (no longer quarantine) in the 3 days prior to surgery, to report if any COVID-19-like illness or household contacts to COVID-19 to determine need for testing

## 2020-12-13 ENCOUNTER — Ambulatory Visit (HOSPITAL_COMMUNITY)
Admission: RE | Admit: 2020-12-13 | Discharge: 2020-12-13 | Disposition: A | Discharge status: Home / Self Care | Payer: PPO | Attending: Cardiology | Admitting: Cardiology

## 2020-12-13 ENCOUNTER — Ambulatory Visit (HOSPITAL_COMMUNITY): Payer: PPO | Admitting: Certified Registered Nurse Anesthetist

## 2020-12-13 ENCOUNTER — Other Ambulatory Visit: Payer: Self-pay

## 2020-12-13 ENCOUNTER — Encounter (HOSPITAL_COMMUNITY): Payer: Self-pay | Admitting: Cardiology

## 2020-12-13 ENCOUNTER — Encounter (HOSPITAL_COMMUNITY)
Admission: RE | Disposition: A | Discharge status: Home / Self Care | Payer: Self-pay | Source: Home / Self Care | Attending: Cardiology

## 2020-12-13 DIAGNOSIS — I4892 Unspecified atrial flutter: Secondary | ICD-10-CM

## 2020-12-13 DIAGNOSIS — I4891 Unspecified atrial fibrillation: Secondary | ICD-10-CM | POA: Diagnosis not present

## 2020-12-13 DIAGNOSIS — I484 Atypical atrial flutter: Secondary | ICD-10-CM | POA: Diagnosis not present

## 2020-12-13 DIAGNOSIS — I10 Essential (primary) hypertension: Secondary | ICD-10-CM | POA: Insufficient documentation

## 2020-12-13 HISTORY — DX: Unspecified atrial flutter: I48.92

## 2020-12-13 HISTORY — PX: CARDIOVERSION: SHX1299

## 2020-12-13 SURGERY — CARDIOVERSION
Anesthesia: General

## 2020-12-13 MED ORDER — SODIUM CHLORIDE 0.9 % IV SOLN: INTRAVENOUS | Status: DC | PRN

## 2020-12-13 MED ORDER — PROPOFOL 10 MG/ML IV BOLUS
INTRAVENOUS | Status: DC | PRN
  Administered 2020-12-13: 30 mg via INTRAVENOUS
  Administered 2020-12-13: 70 mg via INTRAVENOUS

## 2020-12-13 MED ORDER — LIDOCAINE 2% (20 MG/ML) 5 ML SYRINGE
INTRAMUSCULAR | Status: DC | PRN
  Administered 2020-12-13: 100 mg via INTRAVENOUS

## 2020-12-13 NOTE — Transfer of Care (Signed)
Immediate Anesthesia Transfer of Care Note  Patient: Zachary Lowe  Procedure(s) Performed: CARDIOVERSION  Patient Location: Endoscopy Unit  Anesthesia Type:General  Level of Consciousness: drowsy and patient cooperative  Airway & Oxygen Therapy: Patient Spontanous Breathing  Post-op Assessment: Report given to RN and Post -op Vital signs reviewed and stable  Post vital signs: Reviewed and stable  Last Vitals:  Vitals Value Taken Time  BP 130/63   Temp    Pulse 77   Resp 19   SpO2 100     Last Pain:  Vitals:   12/13/20 0735  TempSrc: Temporal  PainSc: 0-No pain         Complications: No notable events documented.

## 2020-12-13 NOTE — Discharge Instructions (Signed)

## 2020-12-13 NOTE — Anesthesia Procedure Notes (Signed)
Procedure Name: General with mask airway Date/Time: 12/13/2020 8:40 AM Performed by: Janene Harvey, CRNA Pre-anesthesia Checklist: Patient identified, Emergency Drugs available, Suction available and Patient being monitored Patient Re-evaluated:Patient Re-evaluated prior to induction Oxygen Delivery Method: Ambu bag Induction Type: IV induction Placement Confirmation: positive ETCO2 Dental Injury: Teeth and Oropharynx as per pre-operative assessment

## 2020-12-13 NOTE — H&P (Signed)
Zachary Lowe is a 70 year old male with history of recent atrial flutter, HTN who presents for cardioversion for Aflutter.  Denies any chest pain or palpitations, but reports dyspnea with exertion.  Telemetry shows AFL with rate 70s.  GEN: Well nourished, well developed, in no acute distress HEENT: normal Neck: no JVD Cardiac: RRR; no murmurs, rubs, or gallops,no edema  Respiratory:  clear to auscultation bilaterally, normal work of breathing GI: soft, nontender MS: no deformity or atrophy Skin: warm and dry, no rash Neuro:  Alert and Oriented x 3 Psych: normal affect  A/P 82M with persistent atrial flutter.  Reports compliance with Eliquis, no missed doses over last 3 weeks.  Will proceed with DCCV.  Donato Heinz, MD

## 2020-12-13 NOTE — CV Procedure (Signed)
Procedure:   DCCV  Indication:  Symptomatic atrial flutter  Procedure Note:  The patient signed informed consent.  They have had had therapeutic anticoagulation with Eliquis greater than 3 weeks.  Anesthesia was administered by Dr. Ambrose Pancoast and Reather Laurence, CRNA.  Adequate airway was maintained throughout and vital followed per protocol.  They were cardioverted x 1 with 100J of biphasic synchronized energy.  They converted to NSR with rate 70s.  There were no apparent complications.  The patient had normal neuro status and respiratory status post procedure with vitals stable as recorded elsewhere.    Follow up:  They will continue on current medical therapy and follow up with cardiology as scheduled.  Oswaldo Milian, MD 12/13/2020 8:48 AM

## 2020-12-13 NOTE — Anesthesia Preprocedure Evaluation (Signed)
Anesthesia Evaluation  Patient identified by MRN, date of birth, ID band Patient awake    Reviewed: Allergy & Precautions, H&P , NPO status , Patient's Chart, lab work & pertinent test results, reviewed documented beta blocker date and time   Airway Mallampati: III  TM Distance: >3 FB Neck ROM: full    Dental no notable dental hx. (+) Teeth Intact, Caps, Dental Advisory Given   Pulmonary neg pulmonary ROS, former smoker,    Pulmonary exam normal breath sounds clear to auscultation       Cardiovascular Exercise Tolerance: Good hypertension, Pt. on medications and Pt. on home beta blockers + dysrhythmias Atrial Fibrillation  Rhythm:regular Rate:Normal  ECHO 11/22 Normal heart pumping function, normal EF 60-65%. Mild biatrial enlargement. Moderate calcium build up on aortic valve with no evidence of stenosis.    Neuro/Psych negative neurological ROS  negative psych ROS   GI/Hepatic negative GI ROS, Neg liver ROS,   Endo/Other  negative endocrine ROS  Renal/GU negative Renal ROS  negative genitourinary   Musculoskeletal   Abdominal   Peds  Hematology negative hematology ROS (+)   Anesthesia Other Findings   Reproductive/Obstetrics negative OB ROS                             Anesthesia Physical Anesthesia Plan  ASA: 3  Anesthesia Plan: General   Post-op Pain Management:    Induction: Intravenous  PONV Risk Score and Plan: 2  Airway Management Planned: Natural Airway, Nasal Cannula and Simple Face Mask  Additional Equipment: None  Intra-op Plan:   Post-operative Plan:   Informed Consent: I have reviewed the patients History and Physical, chart, labs and discussed the procedure including the risks, benefits and alternatives for the proposed anesthesia with the patient or authorized representative who has indicated his/her understanding and acceptance.     Dental Advisory  Given  Plan Discussed with: CRNA and Anesthesiologist  Anesthesia Plan Comments:         Anesthesia Quick Evaluation

## 2020-12-13 NOTE — Anesthesia Postprocedure Evaluation (Signed)
Anesthesia Post Note  Patient: Zachary Lowe  Procedure(s) Performed: CARDIOVERSION     Patient location during evaluation: PACU Anesthesia Type: General Level of consciousness: awake and alert Pain management: pain level controlled Vital Signs Assessment: post-procedure vital signs reviewed and stable Respiratory status: spontaneous breathing, nonlabored ventilation, respiratory function stable and patient connected to nasal cannula oxygen Cardiovascular status: blood pressure returned to baseline and stable Postop Assessment: no apparent nausea or vomiting Anesthetic complications: no   No notable events documented.  Last Vitals:  Vitals:   12/13/20 0857 12/13/20 0910  BP: (!) 131/100 129/90  Pulse: 72 74  Resp: 15 16  Temp:    SpO2: 97% 97%    Last Pain:  Vitals:   12/13/20 0847  TempSrc:   PainSc: 0-No pain                 Destina Mantei

## 2020-12-14 ENCOUNTER — Ambulatory Visit (HOSPITAL_COMMUNITY): Payer: PPO | Admitting: Physician Assistant

## 2020-12-14 DIAGNOSIS — L918 Other hypertrophic disorders of the skin: Secondary | ICD-10-CM | POA: Diagnosis not present

## 2020-12-14 DIAGNOSIS — D2371 Other benign neoplasm of skin of right lower limb, including hip: Secondary | ICD-10-CM | POA: Diagnosis not present

## 2020-12-14 DIAGNOSIS — L400 Psoriasis vulgaris: Secondary | ICD-10-CM | POA: Diagnosis not present

## 2020-12-14 DIAGNOSIS — L821 Other seborrheic keratosis: Secondary | ICD-10-CM | POA: Diagnosis not present

## 2020-12-14 DIAGNOSIS — D1801 Hemangioma of skin and subcutaneous tissue: Secondary | ICD-10-CM | POA: Diagnosis not present

## 2020-12-14 DIAGNOSIS — L570 Actinic keratosis: Secondary | ICD-10-CM | POA: Diagnosis not present

## 2020-12-15 ENCOUNTER — Encounter (HOSPITAL_COMMUNITY): Payer: Self-pay | Admitting: Cardiology

## 2020-12-20 ENCOUNTER — Ambulatory Visit (HOSPITAL_COMMUNITY): Payer: PPO | Admitting: Physician Assistant

## 2020-12-28 ENCOUNTER — Other Ambulatory Visit: Payer: Self-pay

## 2020-12-28 ENCOUNTER — Encounter (HOSPITAL_COMMUNITY): Payer: Self-pay | Admitting: Physician Assistant

## 2020-12-28 ENCOUNTER — Ambulatory Visit (HOSPITAL_COMMUNITY)
Admission: RE | Admit: 2020-12-28 | Discharge: 2020-12-28 | Disposition: A | Discharge status: Home / Self Care | Payer: PPO | Source: Ambulatory Visit | Attending: Physician Assistant | Admitting: Physician Assistant

## 2020-12-28 VITALS — BP 130/90 | HR 94 | Ht 66.0 in | Wt 184.8 lb

## 2020-12-28 DIAGNOSIS — I4439 Other atrioventricular block: Secondary | ICD-10-CM | POA: Insufficient documentation

## 2020-12-28 DIAGNOSIS — I10 Essential (primary) hypertension: Secondary | ICD-10-CM | POA: Diagnosis not present

## 2020-12-28 DIAGNOSIS — Z8616 Personal history of COVID-19: Secondary | ICD-10-CM | POA: Insufficient documentation

## 2020-12-28 DIAGNOSIS — D6869 Other thrombophilia: Secondary | ICD-10-CM | POA: Diagnosis not present

## 2020-12-28 DIAGNOSIS — E785 Hyperlipidemia, unspecified: Secondary | ICD-10-CM | POA: Insufficient documentation

## 2020-12-28 DIAGNOSIS — I483 Typical atrial flutter: Secondary | ICD-10-CM | POA: Insufficient documentation

## 2020-12-28 DIAGNOSIS — Z79899 Other long term (current) drug therapy: Secondary | ICD-10-CM | POA: Diagnosis not present

## 2020-12-28 DIAGNOSIS — I484 Atypical atrial flutter: Secondary | ICD-10-CM | POA: Diagnosis not present

## 2020-12-28 DIAGNOSIS — Z7901 Long term (current) use of anticoagulants: Secondary | ICD-10-CM | POA: Diagnosis not present

## 2020-12-28 DIAGNOSIS — R9431 Abnormal electrocardiogram [ECG] [EKG]: Secondary | ICD-10-CM | POA: Insufficient documentation

## 2020-12-28 NOTE — Progress Notes (Signed)
Primary Care Physician: Zachary Manes, MD Primary Cardiologist: none Primary Electrophysiologist: none Referring Physician: Dr Zachary Lowe is a 70 y.o. male with a history of HTN, HLD, and atrial flutter who presents for follow up in the [name].  The patient was initially diagnosed with atrial flutter on 11/08/20 after presenting to his PCP with symptoms of increased dyspnea on exertion. He had COVID-19 in 08/2018 and has felt this dyspnea since. ECG at his PCP showed atrial flutter. Patient was started on Eliquis for a CHADS2VASC score of 2. He denies significant snoring or alcohol use.   On follow up today, patient is s/p DCCV on 12/13/20. Unfortunately, he felt he was back out of rhythm about 5 days later. There was no specific trigger that he could identify. He denies any bleeding issues on anticoagulation.   Today, he denies symptoms of chest pain, orthopnea, PND, lower extremity edema, dizziness, presyncope, syncope, snoring, daytime somnolence, bleeding, or neurologic sequela. The patient is tolerating medications without difficulties and is otherwise without complaint today.    Atrial Fibrillation Risk Factors:  he does not have symptoms or diagnosis of sleep apnea. he does not have a history of rheumatic fever. he does not have a history of alcohol use. The patient does not have a history of early familial atrial fibrillation or other arrhythmias.  he has a BMI of Body mass index is 29.83 kg/m.Marland Kitchen Filed Weights   12/28/20 1004  Weight: 83.8 kg     No family history on file.   Atrial Fibrillation Management history:  Previous antiarrhythmic drugs: none Previous cardioversions: 12/13/20 Previous ablations: none CHADS2VASC score: 2 Anticoagulation history: Eliquis   Past Medical History:  Diagnosis Date   Atrial flutter ([name])    Hypertension    Past Surgical History:  Procedure Laterality Date   CARDIOVERSION  N/A 12/13/2020   Procedure: CARDIOVERSION;  Surgeon: Zachary Heinz, MD;  Location: [facility] ENDOSCOPY;  Service: Cardiovascular;  Laterality: N/A;   CYST EXCISION     pylonidal cyst   TONSILLECTOMY      Current Outpatient Medications  Medication Sig Dispense Refill   Coenzyme Q10 100 MG capsule Take 100 mg by mouth daily.     ELIQUIS 5 MG TABS tablet Take 5 mg by mouth 2 (two) times daily.     losartan (COZAAR) 100 MG tablet Take 100 mg by mouth daily.     metoprolol succinate (TOPROL-XL) 50 MG 24 hr tablet Take 75 mg by mouth daily.     Omega-3 Fatty Acids (FISH OIL PO) Take 5 mLs by mouth daily.     pravastatin (PRAVACHOL) 40 MG tablet Take 40 mg by mouth daily.     tamsulosin (FLOMAX) 0.4 MG CAPS capsule Take 0.4 mg by mouth daily.     VTAMA 1 % CREA Apply  as directed to affected area once a day     No current facility-administered medications for this encounter.    Allergies  Allergen Reactions   Lisinopril Cough   Statins Other (See Comments)    Muscle pain    Social History   Socioeconomic History   Marital status: Married    Spouse name: Not on file   Number of children: Not on file   Years of education: Not on file   Highest education level: Not on file  Occupational History   Not on file  Tobacco Use   Smoking status: Former    Types: Cigarettes  Quit date: 66    Years since quitting: 43.9   Smokeless tobacco: Never   Tobacco comments:    Former smoker 11/12/2020  Substance and Sexual Activity   Alcohol use: Yes    Alcohol/week: 14.0 - 18.0 standard drinks    Types: 14 - 18 Glasses of wine per week    Comment: 2-3 glasses of wine daily   Drug use: Never   Sexual activity: Not on file  Other Topics Concern   Not on file  Social History Narrative   Not on file   Social Determinants of Health   Financial Resource Strain: Not on file  Food Insecurity: Not on file  Transportation Needs: Not on file  Physical Activity: Not on file  Stress:  Not on file  Social Connections: Not on file  Intimate Partner Violence: Not on file     ROS- All systems are reviewed and negative except as per the HPI above.  Physical Exam: Vitals:   12/28/20 1004  BP: 130/90  Pulse: 94  Weight: 83.8 kg  Height: 5\' 6"  (1.676 m)    GEN- The patient is a well appearing male, alert and oriented x 3 today.   HEENT-head normocephalic, atraumatic, sclera clear, conjunctiva pink, hearing intact, trachea midline. Lungs- Clear to ausculation bilaterally, normal work of breathing Heart- irregular rate and rhythm, no murmurs, rubs or gallops  GI- soft, NT, ND, + BS Extremities- no clubbing, cyanosis, or edema MS- no significant deformity or atrophy Skin- no rash or lesion Psych- euthymic mood, full affect Neuro- strength and sensation are intact   Wt Readings from Last 3 Encounters:  12/28/20 83.8 kg  12/13/20 81.6 kg  11/12/20 86.3 kg    EKG today demonstrates  Typical atrial flutter with variable block Vent. rate 94 BPM PR interval * ms QRS duration 70 ms QT/QTcB 310/387 ms  Echo 11/15/20 demonstrated  1. Left ventricular ejection fraction, by estimation, is 60 to 65%. The  left ventricle has normal function. The left ventricle has no regional  wall motion abnormalities. Left ventricular diastolic function could not  be evaluated.   2. Right ventricular systolic function is normal. The right ventricular  size is normal. There is normal pulmonary artery systolic pressure.   3. Left atrial size was mildly dilated.   4. Right atrial size was mildly dilated.   5. The mitral valve is normal in structure. Trivial mitral valve  regurgitation.   6. The aortic valve has an indeterminant number of cusps. There is  moderate calcification of the aortic valve. There is mild thickening of  the aortic valve. Aortic valve regurgitation is not visualized. Mild to  moderate aortic valve sclerosis/calcification is present, without any evidence of  aortic stenosis.   7. The inferior vena cava is normal in size with <50% respiratory  variability, suggesting right atrial pressure of 8 mmHg.   Comparison(s): No prior Echocardiogram.   Conclusion(s)/Recommendation(s): Otherwise normal echocardiogram, with minor abnormalities described in the report. In atrial flutter throughout study, diastology cannot be assessed.   Epic records are reviewed at length today  CHA2DS2-VASc Score = 2  The patient's score is based upon: CHF History: 0 HTN History: 1 Diabetes History: 0 Stroke History: 0 Vascular Disease History: 0 Age Score: 1 Gender Score: 0      ASSESSMENT AND PLAN: 1. Typical and Atypical atrial flutter The patient's CHA2DS2-VASc score is 2, indicating a 2.2% annual risk of stroke. S/p DCCV on 12/13/20 with early return of flutter. His  flutter today appears more typical. We discussed rhythm control options including AAD and ablation. He would prefer to avoid long term medications if possible. Will refer to EP to discuss possible ablation.  Continue Eliquis 5 mg BID Continue Toprol 75 mg daily  2. Secondary Hypercoagulable State (ICD10:  D68.69) The patient is at significant risk for stroke/thromboembolism based upon his CHA2DS2-VASc Score of 2.  Continue Apixaban (Eliquis).   3. HTN Stable, no changes today.   Follow up with EP for ablation evaluation.    [facility] [address] [place], [place] [address] [phone] 12/28/2020 4:51 PM

## 2021-01-13 DIAGNOSIS — L820 Inflamed seborrheic keratosis: Secondary | ICD-10-CM | POA: Diagnosis not present

## 2021-01-13 DIAGNOSIS — D485 Neoplasm of uncertain behavior of skin: Secondary | ICD-10-CM | POA: Diagnosis not present

## 2021-02-01 ENCOUNTER — Ambulatory Visit (INDEPENDENT_AMBULATORY_CARE_PROVIDER_SITE_OTHER): Payer: PPO

## 2021-02-01 ENCOUNTER — Encounter: Payer: Self-pay | Admitting: Cardiology

## 2021-02-01 ENCOUNTER — Other Ambulatory Visit: Payer: Self-pay | Admitting: *Deleted

## 2021-02-01 ENCOUNTER — Encounter: Payer: Self-pay | Admitting: *Deleted

## 2021-02-01 ENCOUNTER — Ambulatory Visit: Payer: PPO | Admitting: Cardiology

## 2021-02-01 ENCOUNTER — Other Ambulatory Visit: Payer: Self-pay

## 2021-02-01 VITALS — BP 122/64 | HR 65 | Ht 66.0 in | Wt 188.6 lb

## 2021-02-01 DIAGNOSIS — I4892 Unspecified atrial flutter: Secondary | ICD-10-CM

## 2021-02-01 DIAGNOSIS — Z01818 Encounter for other preprocedural examination: Secondary | ICD-10-CM | POA: Diagnosis not present

## 2021-02-01 DIAGNOSIS — I10 Essential (primary) hypertension: Secondary | ICD-10-CM | POA: Diagnosis not present

## 2021-02-01 NOTE — Progress Notes (Signed)
Electrophysiology Office Note:    Date:  02/01/2021   ID:  Zachary Lowe, DOB [birth year], MRN [id number]  PCP:  [name], MD  [facility] Cardiologist:  None  [facility] Electrophysiologist:  Vickie Epley, MD   Referring MD: Oliver Barre, PA   Chief Complaint: AFL  History of Present Illness:    Zachary Lowe is a 71 y.o. male who presents for an evaluation of AFL at the request of Adline Peals, PA-C. Their medical history includes HTN, HLD, AFL. His diagnosis of AFL was made 11/08/2020. Eliquis was started by his PC. He had a DCCV 12/13/2020 but went back out of rhythm 5 days after.   He is with his wife today in clinic.     Past Medical History:  Diagnosis Date   Atrial flutter ([place])    Hypertension     Past Surgical History:  Procedure Laterality Date   CARDIOVERSION N/A 12/13/2020   Procedure: CARDIOVERSION;  Surgeon: Donato Heinz, MD;  Location: [facility] ENDOSCOPY;  Service: Cardiovascular;  Laterality: N/A;   CYST EXCISION     pylonidal cyst   TONSILLECTOMY      Current Medications: Current Meds  Medication Sig   Coenzyme Q10 100 MG capsule Take 100 mg by mouth daily.   ELIQUIS 5 MG TABS tablet Take 5 mg by mouth 2 (two) times daily.   losartan (COZAAR) 100 MG tablet Take 100 mg by mouth daily.   metoprolol succinate (TOPROL-XL) 50 MG 24 hr tablet Take 75 mg by mouth daily.   Omega-3 Fatty Acids (FISH OIL PO) Take 5 mLs by mouth daily.   pravastatin (PRAVACHOL) 40 MG tablet Take 40 mg by mouth daily.   tamsulosin (FLOMAX) 0.4 MG CAPS capsule Take 0.4 mg by mouth daily.     Allergies:   Lisinopril and Statins   Social History   Socioeconomic History   Marital status: Married    Spouse name: Not on file   Number of children: Not on file   Years of education: Not on file   Highest education level: Not on file  Occupational History   Not on file  Tobacco Use   Smoking status: Former    Types: Cigarettes    Quit date: 1979     Years since quitting: 44.0   Smokeless tobacco: Never   Tobacco comments:    Former smoker 11/12/2020  Substance and Sexual Activity   Alcohol use: Yes    Alcohol/week: 14.0 - 18.0 standard drinks    Types: 14 - 18 Glasses of wine per week    Comment: 2-3 glasses of wine daily   Drug use: Never   Sexual activity: Not on file  Other Topics Concern   Not on file  Social History Narrative   Not on file   Social Determinants of Health   Financial Resource Strain: Not on file  Food Insecurity: Not on file  Transportation Needs: Not on file  Physical Activity: Not on file  Stress: Not on file  Social Connections: Not on file     Family History: The patient's family history is not on file.  ROS:   Please see the history of present illness.    All other systems reviewed and are negative.  EKGs/Labs/Other Studies Reviewed:    The following studies were reviewed today:  11/15/2020 Echo LV normal RV normal Mildly dilated atria Trivial MR AV thickening without stenosis  11/12/2020 and 12/28/2020 ECGs show typical atrial flutter with variable  AV conduction  EKG:  The ekg ordered today demonstrates typical appearing atrial flutter.   Recent Labs: 12/07/2020: BUN 12; Creatinine, Ser 0.92; Hemoglobin 16.3; Platelets 171; Potassium 4.4; Sodium 131  Recent Lipid Panel No results found for: CHOL, TRIG, HDL, CHOLHDL, VLDL, LDLCALC, LDLDIRECT  Physical Exam:    VS:  BP 122/64    Pulse 65    Ht 5\' 6"  (1.676 m)    Wt 188 lb 9.6 oz (85.5 kg)    SpO2 97%    BMI 30.44 kg/m     Wt Readings from Last 3 Encounters:  02/01/21 188 lb 9.6 oz (85.5 kg)  12/28/20 184 lb 12.8 oz (83.8 kg)  12/13/20 180 lb (81.6 kg)     GEN:  Well nourished, well developed in no acute distress HEENT: Normal NECK: No JVD; No carotid bruits LYMPHATICS: No lymphadenopathy CARDIAC: RRR, no murmurs, rubs, gallops RESPIRATORY:  Clear to auscultation without rales, wheezing or rhonchi  ABDOMEN: Soft,  non-tender, non-distended MUSCULOSKELETAL:  No edema; No deformity  SKIN: Warm and dry NEUROLOGIC:  Alert and oriented x 3 PSYCHIATRIC:  Normal affect       ASSESSMENT:    1. Atrial flutter, unspecified type ([place])   2. Primary hypertension   3. Pre-op evaluation    PLAN:    In order of problems listed above:  #Typical atrial flutter Patient has symptomatic typical atrial flutter.  Thus far I do not have any evidence of atrial fibrillation.  We discussed the treatment options available to him for his typical atrial flutter including catheter ablation or antiarrhythmic drug therapy.  I do think he is an excellent candidate for catheter ablation which also provides the most durable freedom from a flutter recurrence.  I discussed the possibility of developing atrial fibrillation after an atrial flutter ablation.  I would like to start with a 1 week ZIO monitor to exclude any readily observable atrial fibrillation which would change our ablation approach.  We will go ahead and get him scheduled for atypical flutter ablation.  In the meantime, he will continue to take Eliquis for stroke prophylaxis.  If atrial fibrillation is detected on the ZIO monitor, will need to get a CT scan of the left atrium prior to the procedure.  I did discuss with atrial fibrillation ablation and atrial flutter ablation during today's clinic appointment.  Risk, benefits, and alternatives to EP study and radiofrequency ablation for afib/flutter were also discussed in detail today. These risks include but are not limited to stroke, bleeding, vascular damage, tamponade, perforation, damage to the esophagus, lungs, and other structures, pulmonary vein stenosis, worsening renal function, and death. The patient understands these risk and wishes to proceed.  We will therefore proceed with catheter ablation at the next available time.  Carto, ICE, anesthesia are requested for the procedure.    #Hypertension Controlled.   Continue losartan, metoprolol.   Total time spent with patient today 60 minutes. This includes reviewing records, evaluating the patient and coordinating care.  Medication Adjustments/Labs and Tests Ordered: Current medicines are reviewed at length with the patient today.  Concerns regarding medicines are outlined above.  Orders Placed This Encounter  Procedures   CBC w/Diff   Basic Metabolic Panel (BMET)   LONG TERM MONITOR (3-14 DAYS)   EKG 12-Lead   No orders of the defined types were placed in this encounter.    Signed, Hilton Cork. Quentin Ore, MD, [facility], [facility] 02/01/2021 10:37 PM    Electrophysiology [facility]

## 2021-02-01 NOTE — Progress Notes (Unsigned)
Enrolled for [employer] to mail a ZIO XT long term holter monitor to the patients address on file.  

## 2021-02-01 NOTE — Patient Instructions (Addendum)
Medication Instructions:  Your physician recommends that you continue on your current medications as directed. Please refer to the Current Medication list given to you today. *If you need a refill on your cardiac medications before your next appointment, please call your pharmacy*  Lab Work: None. If you have labs (blood work) drawn today and your tests are completely normal, you will receive your results only by: [place] (if you have MyChart) OR A paper copy in the mail If you have any lab test that is abnormal or we need to change your treatment, we will call you to review the results.  Testing/Procedures: Your physician has recommended that you wear a heart monitor. Holter monitors are medical devices that record the hearts electrical activity. Doctors most often use these monitors to diagnose arrhythmias. Arrhythmias are problems with the speed or rhythm of the heartbeat. The monitor is a small, portable device. You can wear one while you do your normal daily activities. This is usually used to diagnose what is causing palpitations/syncope (passing out).  Your physician has recommended that you have an ablation. Catheter ablation is a medical procedure used to treat some cardiac arrhythmias (irregular heartbeats). During catheter ablation, a long, thin, flexible tube is put into a blood vessel in your groin (upper thigh), or neck. This tube is called an ablation catheter. It is then guided to your heart through the blood vessel. Radio frequency waves destroy small areas of heart tissue where abnormal heartbeats may cause an arrhythmia to start. Please see the instruction sheet given to you today.    Follow-Up: At [facility], you and your health needs are our priority.  As part of our continuing mission to provide you with exceptional heart care, we have created designated Provider Care Teams.  These Care Teams include your primary Cardiologist (physician) and Advanced Practice  Providers (APPs -  Physician Assistants and Nurse Practitioners) who all work together to provide you with the care you need, when you need it.  Your physician wants you to follow-up in: see instruction letter.  We recommend signing up for the patient portal called "MyChart".  Sign up information is provided on this After Visit Summary.  MyChart is used to connect with patients for Virtual Visits (Telemedicine).  Patients are able to view lab/test results, encounter notes, upcoming appointments, etc.  Non-urgent messages can be sent to your provider as well.   To learn more about what you can do with MyChart, go to [email or web address].    Any Other Special Instructions Will Be Listed Below (If Applicable).  ZIO XT- Long Term Monitor Instructions  Your physician has requested you wear a ZIO patch monitor for 14 days.  This is a single patch monitor. [employer] supplies one patch monitor per enrollment. Additional stickers are not available. Please do not apply patch if you will be having a Nuclear Stress Test,  Echocardiogram, Cardiac CT, MRI, or Chest Xray during the period you would be wearing the  monitor. The patch cannot be worn during these tests. You cannot remove and re-apply the  ZIO XT patch monitor.  Your ZIO patch monitor will be mailed 3 day USPS to your address on file. It may take 3-5 days  to receive your monitor after you have been enrolled.  Once you have received your monitor, please review the enclosed instructions. Your monitor  has already been registered assigning a specific monitor serial # to you.  Billing and Patient Assistance Program Information  We have supplied [employer]  with any of your insurance information on file for billing purposes. [employer] offers a sliding scale Patient Assistance Program for patients that do not have  insurance, or whose insurance does not completely cover the cost of the ZIO monitor.  You must apply for the Patient Assistance Program to  qualify for this discounted rate.  To apply, please call [employer] at [phone], select option 4, select option 2, ask to apply for  Patient Assistance Program. Theodore Demark will ask your household income, and how many people  are in your household. They will quote your out-of-pocket cost based on that information.  [employer] will also be able to set up a 59-month, interest-free payment plan if needed.  Applying the monitor   Shave hair from upper left chest.  Hold abrader disc by orange tab. Rub abrader in 40 strokes over the upper left chest as  indicated in your monitor instructions.  Clean area with 4 enclosed alcohol pads. Let dry.  Apply patch as indicated in monitor instructions. Patch will be placed under collarbone on left  side of chest with arrow pointing upward.  Rub patch adhesive wings for 2 minutes. Remove white label marked "1". Remove the white  label marked "2". Rub patch adhesive wings for 2 additional minutes.  While looking in a mirror, press and release button in center of patch. A small green light will  flash 3-4 times. This will be your only indicator that the monitor has been turned on.  Do not shower for the first 24 hours. You may shower after the first 24 hours.  Press the button if you feel a symptom. You will hear a small click. Record Date, Time and  Symptom in the Patient Logbook.  When you are ready to remove the patch, follow instructions on the last 2 pages of Patient  Logbook. Stick patch monitor onto the last page of Patient Logbook.  Place Patient Logbook in the blue and white box. Use locking tab on box and tape box closed  securely. The blue and white box has prepaid postage on it. Please place it in the mailbox as  soon as possible. Your physician should have your test results approximately 7 days after the  monitor has been mailed back to [facility].  Call [place] at [phone] if you have questions regarding  your ZIO XT  patch monitor. Call them immediately if you see an orange light blinking on your  monitor.  If your monitor falls off in less than 4 days, contact our Monitor department at [phone].  If your monitor becomes loose or falls off after 4 days call [employer] at [phone] for  suggestions on securing your monitor   Cardiac Ablation Cardiac ablation is a procedure to destroy (ablate) some heart tissue that is sending bad signals. These bad signals cause problems in heart rhythm. The heart has many areas that make these signals. If there are problems in these areas, they can make the heart beat in a way that is not normal. Destroying some tissues can help make the heart rhythm normal. Tell your doctor about: Any allergies you have. All medicines you are taking. These include vitamins, herbs, eye drops, creams, and over-the-counter medicines. Any problems you or family members have had with medicines that make you fall asleep (anesthetics). Any blood disorders you have. Any surgeries you have had. Any medical conditions you have, such as kidney failure. Whether you are pregnant or may be pregnant. What are the risks? This is  a safe procedure. But problems may occur, including: Infection. Bruising and bleeding. Bleeding into the chest. Stroke or blood clots. Damage to nearby areas of your body. Allergies to medicines or dyes. The need for a pacemaker if the normal system is damaged. Failure of the procedure to treat the problem. What happens before the procedure? Medicines Ask your doctor about: Changing or stopping your normal medicines. This is important. Taking aspirin and ibuprofen. Do not take these medicines unless your doctor tells you to take them. Taking other medicines, vitamins, herbs, and supplements. General instructions Follow instructions from your doctor about what you cannot eat or drink. Plan to have someone take you home from the hospital or clinic. If you will  be going home right after the procedure, plan to have someone with you for 24 hours. Ask your doctor what steps will be taken to prevent infection. What happens during the procedure?  An IV tube will be put into one of your veins. You will be given a medicine to help you relax. The skin on your neck or groin will be numbed. A cut (incision) will be made in your neck or groin. A needle will be put through your cut and into a large vein. A tube (catheter) will be put into the needle. The tube will be moved to your heart. Dye may be put through the tube. This helps your doctor see your heart. Small devices (electrodes) on the tube will send out signals. A type of energy will be used to destroy some heart tissue. The tube will be taken out. Pressure will be held on your cut. This helps stop bleeding. A bandage will be put over your cut. The exact procedure may vary among doctors and hospitals. What happens after the procedure? You will be watched until you leave the hospital or clinic. This includes checking your heart rate, breathing rate, oxygen, and blood pressure. Your cut will be watched for bleeding. You will need to lie still for a few hours. Do not drive for 24 hours or as long as your doctor tells you. Summary Cardiac ablation is a procedure to destroy some heart tissue. This is done to treat heart rhythm problems. Tell your doctor about any medical conditions you may have. Tell him or her about all medicines you are taking to treat them. This is a safe procedure. But problems may occur. These include infection, bruising, bleeding, and damage to nearby areas of your body. Follow what your doctor tells you about food and drink. You may also be told to change or stop some of your medicines. After the procedure, do not drive for 24 hours or as long as your doctor tells you. This information is not intended to replace advice given to you by your health care provider. Make sure you discuss  any questions you have with your health care provider. Document Revised: 12/12/2018 Document Reviewed: 12/12/2018 Elsevier Patient Education  2022 [employer].

## 2021-02-03 ENCOUNTER — Institutional Professional Consult (permissible substitution): Payer: PPO | Admitting: Internal Medicine

## 2021-02-03 ENCOUNTER — Encounter: Payer: Self-pay | Admitting: Cardiology

## 2021-02-03 DIAGNOSIS — I4892 Unspecified atrial flutter: Secondary | ICD-10-CM

## 2021-02-03 DIAGNOSIS — I10 Essential (primary) hypertension: Secondary | ICD-10-CM

## 2021-02-08 ENCOUNTER — Encounter: Payer: Self-pay | Admitting: Cardiology

## 2021-02-08 DIAGNOSIS — E7800 Pure hypercholesterolemia, unspecified: Secondary | ICD-10-CM | POA: Diagnosis not present

## 2021-02-08 DIAGNOSIS — D6869 Other thrombophilia: Secondary | ICD-10-CM | POA: Diagnosis not present

## 2021-02-08 DIAGNOSIS — E222 Syndrome of inappropriate secretion of antidiuretic hormone: Secondary | ICD-10-CM | POA: Diagnosis not present

## 2021-02-08 DIAGNOSIS — Z1389 Encounter for screening for other disorder: Secondary | ICD-10-CM | POA: Diagnosis not present

## 2021-02-08 DIAGNOSIS — I10 Essential (primary) hypertension: Secondary | ICD-10-CM | POA: Diagnosis not present

## 2021-02-08 DIAGNOSIS — Z79899 Other long term (current) drug therapy: Secondary | ICD-10-CM | POA: Diagnosis not present

## 2021-02-08 DIAGNOSIS — Z0000 Encounter for general adult medical examination without abnormal findings: Secondary | ICD-10-CM | POA: Diagnosis not present

## 2021-02-08 DIAGNOSIS — I483 Typical atrial flutter: Secondary | ICD-10-CM | POA: Diagnosis not present

## 2021-02-24 ENCOUNTER — Other Ambulatory Visit: Payer: Self-pay

## 2021-02-24 ENCOUNTER — Other Ambulatory Visit: Payer: PPO

## 2021-02-24 DIAGNOSIS — I4892 Unspecified atrial flutter: Secondary | ICD-10-CM

## 2021-02-24 DIAGNOSIS — I10 Essential (primary) hypertension: Secondary | ICD-10-CM

## 2021-02-24 LAB — CBC WITH DIFFERENTIAL/PLATELET
Basophils Absolute: 0.1 10*3/uL (ref 0.0–0.2)
Basos: 1 %
EOS (ABSOLUTE): 0.2 10*3/uL (ref 0.0–0.4)
Eos: 2 %
Hematocrit: 44.4 % (ref 37.5–51.0)
Hemoglobin: 15.5 g/dL (ref 13.0–17.7)
Immature Grans (Abs): 0.1 10*3/uL (ref 0.0–0.1)
Immature Granulocytes: 1 %
Lymphocytes Absolute: 2.4 10*3/uL (ref 0.7–3.1)
Lymphs: 28 %
MCH: 31.3 pg (ref 26.6–33.0)
MCHC: 34.9 g/dL (ref 31.5–35.7)
MCV: 90 fL (ref 79–97)
Monocytes Absolute: 0.5 10*3/uL (ref 0.1–0.9)
Monocytes: 6 %
Neutrophils Absolute: 5.4 10*3/uL (ref 1.4–7.0)
Neutrophils: 62 %
Platelets: 152 10*3/uL (ref 150–450)
RBC: 4.96 x10E6/uL (ref 4.14–5.80)
RDW: 13.2 % (ref 11.6–15.4)
WBC: 8.7 10*3/uL (ref 3.4–10.8)

## 2021-02-24 LAB — BASIC METABOLIC PANEL
BUN/Creatinine Ratio: 15 (ref 10–24)
BUN: 15 mg/dL (ref 8–27)
CO2: 25 mmol/L (ref 20–29)
Calcium: 9.3 mg/dL (ref 8.6–10.2)
Chloride: 101 mmol/L (ref 96–106)
Creatinine, Ser: 0.97 mg/dL (ref 0.76–1.27)
Glucose: 92 mg/dL (ref 70–99)
Potassium: 4.9 mmol/L (ref 3.5–5.2)
Sodium: 138 mmol/L (ref 134–144)
eGFR: 83 mL/min/{1.73_m2} (ref >59)

## 2021-02-26 ENCOUNTER — Encounter: Payer: Self-pay | Admitting: Cardiology

## 2021-03-02 DIAGNOSIS — I4892 Unspecified atrial flutter: Secondary | ICD-10-CM | POA: Diagnosis not present

## 2021-03-02 DIAGNOSIS — I10 Essential (primary) hypertension: Secondary | ICD-10-CM | POA: Diagnosis not present

## 2021-03-02 DIAGNOSIS — Z01818 Encounter for other preprocedural examination: Secondary | ICD-10-CM | POA: Diagnosis not present

## 2021-03-16 NOTE — Pre-Procedure Instructions (Signed)
Attempted to call patient regarding procedure instructions.  Left voicemail on the following items: Arrival time 0930 Nothing to eat or drink after midnight No meds AM of procedure Responsible person to drive you home and stay with you for 24 hrs

## 2021-03-17 ENCOUNTER — Other Ambulatory Visit: Payer: Self-pay

## 2021-03-17 ENCOUNTER — Ambulatory Visit (HOSPITAL_COMMUNITY)
Admission: RE | Disposition: A | Discharge status: Home / Self Care | Payer: PPO | Source: Home / Self Care | Attending: Cardiology

## 2021-03-17 ENCOUNTER — Ambulatory Visit (HOSPITAL_COMMUNITY)
Admission: RE | Admit: 2021-03-17 | Discharge: 2021-03-17 | Disposition: A | Discharge status: Home / Self Care | Payer: PPO | Attending: Cardiology | Admitting: Cardiology

## 2021-03-17 ENCOUNTER — Ambulatory Visit (HOSPITAL_COMMUNITY): Payer: PPO | Admitting: Anesthesiology

## 2021-03-17 ENCOUNTER — Encounter (HOSPITAL_COMMUNITY): Payer: Self-pay | Admitting: Cardiology

## 2021-03-17 ENCOUNTER — Ambulatory Visit (HOSPITAL_BASED_OUTPATIENT_CLINIC_OR_DEPARTMENT_OTHER): Payer: PPO | Admitting: Anesthesiology

## 2021-03-17 DIAGNOSIS — Z87891 Personal history of nicotine dependence: Secondary | ICD-10-CM | POA: Insufficient documentation

## 2021-03-17 DIAGNOSIS — I10 Essential (primary) hypertension: Secondary | ICD-10-CM | POA: Diagnosis not present

## 2021-03-17 DIAGNOSIS — E785 Hyperlipidemia, unspecified: Secondary | ICD-10-CM | POA: Diagnosis not present

## 2021-03-17 DIAGNOSIS — I4892 Unspecified atrial flutter: Secondary | ICD-10-CM | POA: Diagnosis not present

## 2021-03-17 DIAGNOSIS — I4891 Unspecified atrial fibrillation: Secondary | ICD-10-CM | POA: Diagnosis not present

## 2021-03-17 DIAGNOSIS — Z7901 Long term (current) use of anticoagulants: Secondary | ICD-10-CM | POA: Diagnosis not present

## 2021-03-17 HISTORY — PX: A-FLUTTER ABLATION: EP1230

## 2021-03-17 SURGERY — A-FLUTTER ABLATION
Anesthesia: General

## 2021-03-17 MED ORDER — LIDOCAINE 2% (20 MG/ML) 5 ML SYRINGE
INTRAMUSCULAR | Status: DC | PRN
  Administered 2021-03-17: 80 mg via INTRAVENOUS

## 2021-03-17 MED ORDER — ONDANSETRON HCL 4 MG/2ML IJ SOLN
INTRAMUSCULAR | Status: DC | PRN
  Administered 2021-03-17: 4 mg via INTRAVENOUS

## 2021-03-17 MED ORDER — HEPARIN (PORCINE) IN NACL 2-0.9 UNITS/ML
INTRAMUSCULAR | Status: AC | PRN
  Administered 2021-03-17 (×3): 500 mL

## 2021-03-17 MED ORDER — APIXABAN 5 MG PO TABS
5.0000 mg | ORAL_TABLET | ORAL | Status: AC
  Administered 2021-03-17: 5 mg via ORAL
  Filled 2021-03-17: qty 1

## 2021-03-17 MED ORDER — ACETAMINOPHEN 325 MG PO TABS: 650.0000 mg | ORAL_TABLET | ORAL | Status: DC | PRN

## 2021-03-17 MED ORDER — SODIUM CHLORIDE 0.9 % IV SOLN: INTRAVENOUS | Status: DC

## 2021-03-17 MED ORDER — APIXABAN 5 MG PO TABS
5.0000 mg | ORAL_TABLET | Freq: Two times a day (BID) | ORAL | Status: DC
Start: 2021-03-17 — End: 2021-03-17

## 2021-03-17 MED ORDER — HEPARIN SODIUM (PORCINE) 1000 UNIT/ML IJ SOLN
INTRAMUSCULAR | Status: DC | PRN
  Administered 2021-03-17: 1000 [IU] via INTRAVENOUS

## 2021-03-17 MED ORDER — MIDAZOLAM HCL 5 MG/5ML IJ SOLN
INTRAMUSCULAR | Status: DC | PRN
Start: 2021-03-17 — End: 2021-03-17
  Administered 2021-03-17: 2 mg via INTRAVENOUS

## 2021-03-17 MED ORDER — SODIUM CHLORIDE 0.9% FLUSH: 3.0000 mL | INTRAVENOUS | Status: DC | PRN

## 2021-03-17 MED ORDER — ROCURONIUM BROMIDE 100 MG/10ML IV SOLN
INTRAVENOUS | Status: DC | PRN
  Administered 2021-03-17: 60 mg via INTRAVENOUS

## 2021-03-17 MED ORDER — PHENYLEPHRINE HCL-NACL 20-0.9 MG/250ML-% IV SOLN
INTRAVENOUS | Status: DC | PRN
  Administered 2021-03-17: 50 ug/min via INTRAVENOUS
  Administered 2021-03-17: 35 ug/min via INTRAVENOUS

## 2021-03-17 MED ORDER — SODIUM CHLORIDE 0.9% FLUSH: 3.0000 mL | Freq: Two times a day (BID) | INTRAVENOUS | Status: DC

## 2021-03-17 MED ORDER — HEPARIN SODIUM (PORCINE) 1000 UNIT/ML IJ SOLN
INTRAMUSCULAR | Status: AC
  Filled 2021-03-17: qty 10

## 2021-03-17 MED ORDER — ONDANSETRON HCL 4 MG/2ML IJ SOLN: 4.0000 mg | Freq: Four times a day (QID) | INTRAMUSCULAR | Status: DC | PRN

## 2021-03-17 MED ORDER — SUGAMMADEX SODIUM 200 MG/2ML IV SOLN
INTRAVENOUS | Status: DC | PRN
Start: 2021-03-17 — End: 2021-03-17
  Administered 2021-03-17: 200 mg via INTRAVENOUS

## 2021-03-17 MED ORDER — HEPARIN (PORCINE) IN NACL 1000-0.9 UT/500ML-% IV SOLN
INTRAVENOUS | Status: AC
  Filled 2021-03-17: qty 1000

## 2021-03-17 MED ORDER — PROPOFOL 10 MG/ML IV BOLUS
INTRAVENOUS | Status: DC | PRN
  Administered 2021-03-17: 150 mg via INTRAVENOUS

## 2021-03-17 MED ORDER — FENTANYL CITRATE (PF) 100 MCG/2ML IJ SOLN
INTRAMUSCULAR | Status: DC | PRN
  Administered 2021-03-17 (×2): 50 ug via INTRAVENOUS

## 2021-03-17 MED ORDER — HEPARIN (PORCINE) IN NACL 1000-0.9 UT/500ML-% IV SOLN
INTRAVENOUS | Status: AC
  Filled 2021-03-17: qty 500

## 2021-03-17 MED ORDER — DEXAMETHASONE SODIUM PHOSPHATE 10 MG/ML IJ SOLN
INTRAMUSCULAR | Status: DC | PRN
  Administered 2021-03-17: 10 mg via INTRAVENOUS

## 2021-03-17 MED ORDER — SODIUM CHLORIDE 0.9 % IV SOLN: 250.0000 mL | INTRAVENOUS | Status: DC | PRN

## 2021-03-17 SURGICAL SUPPLY — 13 items
CATH SMTCH THERMOCOOL SF DF (CATHETERS) ×1 IMPLANT
CATH SOUNDSTAR ECO 8FR (CATHETERS) ×1 IMPLANT
CATH WEB BI DIR CSDF CRV REPRO (CATHETERS) ×1 IMPLANT
CLOSURE PERCLOSE PROSTYLE (VASCULAR PRODUCTS) ×3 IMPLANT
PACK EP LATEX FREE (CUSTOM PROCEDURE TRAY) ×2
PACK EP LF (CUSTOM PROCEDURE TRAY) ×1 IMPLANT
PAD DEFIB RADIO PHYSIO CONN (PAD) ×2 IMPLANT
PATCH CARTO3 (PAD) ×1 IMPLANT
SHEATH CARTO VIZIGO SM CVD (SHEATH) ×1 IMPLANT
SHEATH PINNACLE 8F 10CM (SHEATH) ×2 IMPLANT
SHEATH PINNACLE 9F 10CM (SHEATH) ×1 IMPLANT
SHEATH PROBE COVER 6X72 (BAG) ×1 IMPLANT
TUBING SMART ABLATE COOLFLOW (TUBING) ×1 IMPLANT

## 2021-03-17 NOTE — H&P (Signed)
Electrophysiology Office Note:     Date:  02/01/2021    ID:  Zachary Lowe, DOB 1950-02-01, MRN [id number]   PCP:  [name], MD   [facility] Cardiologist:  None  [facility] Electrophysiologist:  Vickie Epley, MD    Referring MD: Oliver Barre, PA    Chief Complaint: AFL   History of Present Illness:     Zachary Lowe is a 71 y.o. male who presents for an evaluation of AFL at the request of Adline Peals, PA-C. Their medical history includes HTN, HLD, AFL. His diagnosis of AFL was made 11/08/2020. Eliquis was started by his PC. He had a DCCV 12/13/2020 but went back out of rhythm 5 days after.    He is with his wife today in clinic.      Objective        Past Medical History:  Diagnosis Date   Atrial flutter ([place])     Hypertension             Past Surgical History:  Procedure Laterality Date   CARDIOVERSION N/A 12/13/2020    Procedure: CARDIOVERSION;  Surgeon: Donato Heinz, MD;  Location: [facility] ENDOSCOPY;  Service: Cardiovascular;  Laterality: N/A;   CYST EXCISION        pylonidal cyst   TONSILLECTOMY          Current Medications: Active Medications      Current Meds  Medication Sig   Coenzyme Q10 100 MG capsule Take 100 mg by mouth daily.   ELIQUIS 5 MG TABS tablet Take 5 mg by mouth 2 (two) times daily.   losartan (COZAAR) 100 MG tablet Take 100 mg by mouth daily.   metoprolol succinate (TOPROL-XL) 50 MG 24 hr tablet Take 75 mg by mouth daily.   Omega-3 Fatty Acids (FISH OIL PO) Take 5 mLs by mouth daily.   pravastatin (PRAVACHOL) 40 MG tablet Take 40 mg by mouth daily.   tamsulosin (FLOMAX) 0.4 MG CAPS capsule Take 0.4 mg by mouth daily.        Allergies:   Lisinopril and Statins    Social History         Socioeconomic History   Marital status: Married      Spouse name: Not on file   Number of children: Not on file   Years of education: Not on file   Highest education level: Not on file  Occupational History   Not on  file  Tobacco Use   Smoking status: Former      Types: Cigarettes      Quit date: 1979      Years since quitting: 44.0   Smokeless tobacco: Never   Tobacco comments:      Former smoker 11/12/2020  Substance and Sexual Activity   Alcohol use: Yes      Alcohol/week: 14.0 - 18.0 standard drinks      Types: 14 - 18 Glasses of wine per week      Comment: 2-3 glasses of wine daily   Drug use: Never   Sexual activity: Not on file  Other Topics Concern   Not on file  Social History Narrative   Not on file    Social Determinants of Health    Financial Resource Strain: Not on file  Food Insecurity: Not on file  Transportation Needs: Not on file  Physical Activity: Not on file  Stress: Not on file  Social Connections: Not on file  Family History: The patient's family history is not on file.   ROS:   Please see the history of present illness.    All other systems reviewed and are negative.   EKGs/Labs/Other Studies Reviewed:     The following studies were reviewed today:   11/15/2020 Echo LV normal RV normal Mildly dilated atria Trivial MR AV thickening without stenosis   11/12/2020 and 12/28/2020 ECGs show typical atrial flutter with variable AV conduction   EKG:  The ekg ordered today demonstrates typical appearing atrial flutter.     Recent Labs: 12/07/2020: BUN 12; Creatinine, Ser 0.92; Hemoglobin 16.3; Platelets 171; Potassium 4.4; Sodium 131  Recent Lipid Panel Labs (Brief)  No results found for: CHOL, TRIG, HDL, CHOLHDL, VLDL, LDLCALC, LDLDIRECT     Physical Exam:     VS:  BP 122/64    Pulse 65    Ht 5\' 6"  (1.676 m)    Wt 188 lb 9.6 oz (85.5 kg)    SpO2 97%    BMI 30.44 kg/m         Wt Readings from Last 3 Encounters:  02/01/21 188 lb 9.6 oz (85.5 kg)  12/28/20 184 lb 12.8 oz (83.8 kg)  12/13/20 180 lb (81.6 kg)      GEN:  Well nourished, well developed in no acute distress HEENT: Normal NECK: No JVD; No carotid bruits LYMPHATICS: No  lymphadenopathy CARDIAC: RRR, no murmurs, rubs, gallops RESPIRATORY:  Clear to auscultation without rales, wheezing or rhonchi  ABDOMEN: Soft, non-tender, non-distended MUSCULOSKELETAL:  No edema; No deformity  SKIN: Warm and dry NEUROLOGIC:  Alert and oriented x 3 PSYCHIATRIC:  Normal affect          Assessment     ASSESSMENT:     1. Atrial flutter, unspecified type ([place])   2. Primary hypertension   3. Pre-op evaluation     PLAN:     In order of problems listed above:   #Typical atrial flutter Patient has symptomatic typical atrial flutter.  Thus far I do not have any evidence of atrial fibrillation.  We discussed the treatment options available to him for his typical atrial flutter including catheter ablation or antiarrhythmic drug therapy.  I do think he is an excellent candidate for catheter ablation which also provides the most durable freedom from a flutter recurrence.  I discussed the possibility of developing atrial fibrillation after an atrial flutter ablation.  I would like to start with a 1 week ZIO monitor to exclude any readily observable atrial fibrillation which would change our ablation approach.  We will go ahead and get him scheduled for atypical flutter ablation.  In the meantime, he will continue to take Eliquis for stroke prophylaxis.   If atrial fibrillation is detected on the ZIO monitor, will need to get a CT scan of the left atrium prior to the procedure.  I did discuss with atrial fibrillation ablation and atrial flutter ablation during today's clinic appointment.   Risk, benefits, and alternatives to EP study and radiofrequency ablation for afib/flutter were also discussed in detail today. These risks include but are not limited to stroke, bleeding, vascular damage, tamponade, perforation, damage to the esophagus, lungs, and other structures, pulmonary vein stenosis, worsening renal function, and death. The patient understands these risk and wishes to proceed.   We will therefore proceed with catheter ablation at the next available time.  Carto, ICE, anesthesia are requested for the procedure.     #Hypertension Controlled.  Continue losartan, metoprolol.  Total time spent with patient today 60 minutes. This includes reviewing records, evaluating the patient and coordinating care.   Medication Adjustments/Labs and Tests Ordered: Current medicines are reviewed at length with the patient today.  Concerns regarding medicines are outlined above.     Orders Placed This Encounter  Procedures   CBC w/Diff   Basic Metabolic Panel (BMET)   LONG TERM MONITOR (3-14 DAYS)   EKG 12-Lead    No orders of the defined types were placed in this encounter.       Signed, Hilton Cork. Quentin Ore, MD, [facility], [facility] 02/01/2021 10:37 PM    Electrophysiology [facility]      -----------------------------   I have seen, examined the patient, and reviewed the above assessment and plan.    Plan for CTI ablation. Procedure reviewed.   Vickie Epley, MD 03/17/2021 11:00 AM

## 2021-03-17 NOTE — Transfer of Care (Signed)
Immediate Anesthesia Transfer of Care Note  Patient: Zachary Lowe  Procedure(s) Performed: A-FLUTTER ABLATION  Patient Location: PACU  Anesthesia Type:General  Level of Consciousness: awake, alert  and oriented  Airway & Oxygen Therapy: Patient Spontanous Breathing  Post-op Assessment: Report given to RN and Post -op Vital signs reviewed and stable  Post vital signs: Reviewed and stable  Last Vitals:  Vitals Value Taken Time  BP 122/83 03/17/21 1331  Temp 36.2 C 03/17/21 1310  Pulse 72 03/17/21 1331  Resp 0 03/17/21 1331  SpO2 98 % 03/17/21 1331  Vitals shown include unvalidated device data.  Last Pain:  Vitals:   03/17/21 1310  TempSrc: Temporal  PainSc:          Complications: No notable events documented.

## 2021-03-17 NOTE — Anesthesia Procedure Notes (Signed)
Procedure Name: Intubation Date/Time: 03/17/2021 11:44 AM Performed by: Gwyndolyn Saxon, CRNA Pre-anesthesia Checklist: Patient identified, Emergency Drugs available, Suction available and Patient being monitored Patient Re-evaluated:Patient Re-evaluated prior to induction Oxygen Delivery Method: Circle system utilized Preoxygenation: Pre-oxygenation with 100% oxygen Induction Type: IV induction Ventilation: Mask ventilation without difficulty Laryngoscope Size: Miller and 2 Grade View: Grade II Tube type: Oral Tube size: 7.5 mm Number of attempts: 1 Airway Equipment and Method: Patient positioned with wedge pillow and Stylet Placement Confirmation: ETT inserted through vocal cords under direct vision, positive ETCO2 and breath sounds checked- equal and bilateral Secured at: 23 cm Tube secured with: Tape Dental Injury: Teeth and Oropharynx as per pre-operative assessment

## 2021-03-17 NOTE — Anesthesia Postprocedure Evaluation (Signed)
Anesthesia Post Note  Patient: Zachary Lowe  Procedure(s) Performed: A-FLUTTER ABLATION     Patient location during evaluation: PACU Anesthesia Type: General Level of consciousness: awake and alert Pain management: pain level controlled Vital Signs Assessment: post-procedure vital signs reviewed and stable Respiratory status: spontaneous breathing, nonlabored ventilation, respiratory function stable and patient connected to nasal cannula oxygen Cardiovascular status: blood pressure returned to baseline and stable Postop Assessment: no apparent nausea or vomiting Anesthetic complications: no   No notable events documented.  Last Vitals:  Vitals:   03/17/21 1715 03/17/21 1730  BP: (!) 156/97 (!) 140/91  Pulse: 90 88  Resp: 16 16  Temp:    SpO2: 100% 99%    Last Pain:  Vitals:   03/17/21 1400  TempSrc:   PainSc: 0-No pain                 Opal Dinning

## 2021-03-17 NOTE — Anesthesia Preprocedure Evaluation (Signed)
Anesthesia Evaluation  Patient identified by MRN, date of birth, ID band Patient awake    Reviewed: Allergy & Precautions, H&P , NPO status , Patient's Chart, lab work & pertinent test results, reviewed documented beta blocker date and time   Airway Mallampati: III  TM Distance: >3 FB Neck ROM: full    Dental no notable dental hx. (+) Teeth Intact, Caps, Dental Advisory Given   Pulmonary neg pulmonary ROS, former smoker,    Pulmonary exam normal breath sounds clear to auscultation       Cardiovascular Exercise Tolerance: Good hypertension, Pt. on medications and Pt. on home beta blockers + dysrhythmias Atrial Fibrillation  Rhythm:regular Rate:Normal  ECHO 11/22 Normal heart pumping function, normal EF 60-65%. Mild biatrial enlargement. Moderate calcium build up on aortic valve with no evidence of stenosis.    Neuro/Psych negative neurological ROS  negative psych ROS   GI/Hepatic negative GI ROS, Neg liver ROS,   Endo/Other  negative endocrine ROS  Renal/GU negative Renal ROS  negative genitourinary   Musculoskeletal   Abdominal   Peds  Hematology negative hematology ROS (+)   Anesthesia Other Findings   Reproductive/Obstetrics negative OB ROS                             Anesthesia Physical  Anesthesia Plan  ASA: 3  Anesthesia Plan: General   Post-op Pain Management: Minimal or no pain anticipated   Induction: Intravenous  PONV Risk Score and Plan: 2 and Treatment may vary due to age or medical condition  Airway Management Planned: Oral ETT  Additional Equipment: None  Intra-op Plan:   Post-operative Plan:   Informed Consent: I have reviewed the patients History and Physical, chart, labs and discussed the procedure including the risks, benefits and alternatives for the proposed anesthesia with the patient or authorized representative who has indicated his/her understanding and  acceptance.     Dental Advisory Given  Plan Discussed with: CRNA and Anesthesiologist  Anesthesia Plan Comments:         Anesthesia Quick Evaluation

## 2021-03-17 NOTE — Discharge Instructions (Signed)

## 2021-03-18 ENCOUNTER — Encounter (HOSPITAL_COMMUNITY): Payer: Self-pay | Admitting: Cardiology

## 2021-03-18 ENCOUNTER — Encounter: Payer: Self-pay | Admitting: Cardiology

## 2021-03-18 MED FILL — Heparin Sod (Porcine)-NaCl IV Soln 1000 Unit/500ML-0.9%: INTRAVENOUS | Qty: 1000 | Status: AC

## 2021-03-18 NOTE — Telephone Encounter (Signed)
After discussion with Ria Bush PA .   It could be phrenic nerve irritation from the ablation this will go away on it's own. no real treatment. If he becomes short of breath along with hiccups let us know and we can bring him in for xray (at that point to make sure no phrenic nerve damage)

## 2021-04-19 NOTE — Progress Notes (Signed)
?Electrophysiology Office Follow up Visit Note:   ? ?Date:  04/20/2021  ? ?ID:  Zachary Lowe, DOB 01-09-1951, MRN [id number] ? ?PCP:  [name], MD  ? ?[facility] Electrophysiologist:  Vickie Epley, MD  ? ? ?Interval History:   ? ?Zachary Lowe is a 71 y.o. male who presents for a follow up visit after his atrial flutter ablation on 03/17/2021.  He has done well after the ablation.  He does tell me that he has had some acid reflux type symptoms since the ablation. ? ? ? ? ? ?  ? ?Past Medical History:  ?Diagnosis Date  ? Atrial flutter ([place])   ? Hypertension   ? ? ?Past Surgical History:  ?Procedure Laterality Date  ? A-FLUTTER ABLATION N/A 03/17/2021  ? Procedure: A-FLUTTER ABLATION;  Surgeon: Vickie Epley, MD;  Location: [place] [facility];  Service: Cardiovascular;  Laterality: N/A;  ? CARDIOVERSION N/A 12/13/2020  ? Procedure: CARDIOVERSION;  Surgeon: Donato Heinz, MD;  Location: [facility] ENDOSCOPY;  Service: Cardiovascular;  Laterality: N/A;  ? CYST EXCISION    ? pylonidal cyst  ? TONSILLECTOMY    ? ? ?Current Medications: ?Current Meds  ?Medication Sig  ? aspirin EC 325 MG tablet Take 325 mg by mouth every 6 (six) hours as needed (pain.).  ? Coenzyme Q10 100 MG capsule Take 100 mg by mouth daily at 12 noon.  ? ELIQUIS 5 MG TABS tablet Take 5 mg by mouth 2 (two) times daily.  ? losartan (COZAAR) 100 MG tablet Take 100 mg by mouth every evening.  ? metoprolol succinate (TOPROL-XL) 50 MG 24 hr tablet Take 50 mg by mouth in the morning.  ? Omega-3 Fatty Acids (FISH OIL PO) Take 5 mLs by mouth in the morning. Teaspoon (1600 mg)  ? pravastatin (PRAVACHOL) 40 MG tablet Take 40 mg by mouth every evening.  ? tamsulosin (FLOMAX) 0.4 MG CAPS capsule Take 0.4 mg by mouth in the morning.  ?  ? ?Allergies:   Hydrochlorothiazide, Telmisartan, Lisinopril, and Statins  ? ?Social History  ? ?Socioeconomic History  ? Marital status: Married  ?  Spouse name: Not on file  ? Number of children: Not on file   ? Years of education: Not on file  ? Highest education level: Not on file  ?Occupational History  ? Not on file  ?Tobacco Use  ? Smoking status: Former  ?  Types: Cigarettes  ?  Quit date: 1979  ?  Years since quitting: 44.2  ? Smokeless tobacco: Never  ? Tobacco comments:  ?  Former smoker 11/12/2020  ?Substance and Sexual Activity  ? Alcohol use: Yes  ?  Alcohol/week: 14.0 - 18.0 standard drinks  ?  Types: 14 - 18 Glasses of wine per week  ?  Comment: 2-3 glasses of wine daily  ? Drug use: Never  ? Sexual activity: Not on file  ?Other Topics Concern  ? Not on file  ?Social History Narrative  ? Not on file  ? ?Social Determinants of Health  ? ?Financial Resource Strain: Not on file  ?Food Insecurity: Not on file  ?Transportation Needs: Not on file  ?Physical Activity: Not on file  ?Stress: Not on file  ?Social Connections: Not on file  ?  ? ?Family History: ?The patient's family history is not on file. ? ?ROS:   ?Please see the history of present illness.    ?All other systems reviewed and are negative. ? ?EKGs/Labs/Other Studies Reviewed:   ? ?  The following studies were reviewed today: ? ? ? ?EKG:  The ekg ordered today demonstrates sinus rhythm with frequent PVCs. ? ?Recent Labs: ?02/24/2021: BUN 15; Creatinine, Ser 0.97; Hemoglobin 15.5; Platelets 152; Potassium 4.9; Sodium 138  ?Recent Lipid Panel ?No results found for: CHOL, TRIG, HDL, CHOLHDL, VLDL, LDLCALC, LDLDIRECT ? ?Physical Exam:   ? ?VS:  BP 138/74   Pulse 75   Ht '5\' 6"'$  (1.676 m)   Wt 192 lb 3.2 oz (87.2 kg)   SpO2 96%   BMI 31.02 kg/m?    ? ?Wt Readings from Last 3 Encounters:  ?04/20/21 192 lb 3.2 oz (87.2 kg)  ?03/17/21 185 lb (83.9 kg)  ?02/01/21 188 lb 9.6 oz (85.5 kg)  ?  ? ?GEN:  Well nourished, well developed in no acute distress ?HEENT: Normal ?NECK: No JVD; No carotid bruits ?LYMPHATICS: No lymphadenopathy ?CARDIAC: RRR, no murmurs, rubs, gallops ?RESPIRATORY:  Clear to auscultation without rales, wheezing or rhonchi  ?ABDOMEN: Soft,  non-tender, non-distended ?MUSCULOSKELETAL:  No edema; No deformity  ?SKIN: Warm and dry ?NEUROLOGIC:  Alert and oriented x 3 ?PSYCHIATRIC:  Normal affect  ? ? ? ?  ? ?ASSESSMENT:   ? ?1. Typical atrial flutter ([place])   ?2. Atrial flutter, unspecified type ([place])   ?3. Primary hypertension   ?4. Gastroesophageal reflux disease, unspecified whether esophagitis present   ? ?PLAN:   ? ?In order of problems listed above: ? ?#Typical atrial flutter ?Doing well after his ablation.  No recurrence.  We discussed long-term use of anticoagulation after a flutter ablation.  We discussed the need for atrial fibrillation surveillance.  I discussed using an apple/Galaxy watch, [place] or loop recorder for atrial fibrillation surveillance.  He will plan to use the [employer] monitor.  He will purchase this soon and he will stop his blood thinner 3 months after his ablation.  I will plan to see him back in 1 year. ? ?#Hypertension ?Controlled ? ?#GERD ?Protonix 40 mg by mouth once daily trial.  Follow-up with primary care physician. ? ?Follow-up with the A-fib clinic in 1 year or sooner as needed. ? ? ? ? ?Medication Adjustments/Labs and Tests Ordered: ?Current medicines are reviewed at length with the patient today.  Concerns regarding medicines are outlined above.  ?No orders of the defined types were placed in this encounter. ? ?No orders of the defined types were placed in this encounter. ? ? ? ?Signed, ?Lars Mage, MD, [facility], [place] ?04/20/2021 1:56 PM    ?Electrophysiology ?[place] ?

## 2021-04-20 ENCOUNTER — Ambulatory Visit: Payer: PPO | Admitting: Cardiology

## 2021-04-20 ENCOUNTER — Encounter: Payer: Self-pay | Admitting: Cardiology

## 2021-04-20 VITALS — BP 138/74 | HR 75 | Ht 66.0 in | Wt 192.2 lb

## 2021-04-20 DIAGNOSIS — I10 Essential (primary) hypertension: Secondary | ICD-10-CM | POA: Diagnosis not present

## 2021-04-20 DIAGNOSIS — I483 Typical atrial flutter: Secondary | ICD-10-CM

## 2021-04-20 DIAGNOSIS — I4892 Unspecified atrial flutter: Secondary | ICD-10-CM | POA: Diagnosis not present

## 2021-04-20 DIAGNOSIS — K219 Gastro-esophageal reflux disease without esophagitis: Secondary | ICD-10-CM | POA: Diagnosis not present

## 2021-04-20 MED ORDER — PANTOPRAZOLE SODIUM 40 MG PO TBEC: 40.0000 mg | DELAYED_RELEASE_TABLET | Freq: Every day | ORAL | 0 refills | Status: DC

## 2021-04-20 NOTE — Patient Instructions (Addendum)
Medication Instructions:  ?Stop Eliquis 3 months after your ablation (end of may)  ?Start Protonix 40 mg daily for 6 weeks ?Your physician recommends that you continue on your current medications as directed. Please refer to the Current Medication list given to you today. ?*If you need a refill on your cardiac medications before your next appointment, please call your pharmacy* ? ?Lab Work: ?None. ?If you have labs (blood work) drawn today and your tests are completely normal, you will receive your results only by: ?MyChart Message (if you have MyChart) OR ?A paper copy in the mail ?If you have any lab test that is abnormal or we need to change your treatment, we will call you to review the results. ? ?Testing/Procedures: ?None. ? ?Follow-Up: ?At [facility], you and your health needs are our priority.  As part of our continuing mission to provide you with exceptional heart care, we have created designated Provider Care Teams.  These Care Teams include your primary Cardiologist (physician) and Advanced Practice Providers (APPs -  Physician Assistants and Nurse Practitioners) who all work together to provide you with the care you need, when you need it. ? ?Your physician wants you to follow-up in: 12 months with one of the following Advanced Practice Providers on your designated Care Team:   ? ?Tommye Standard, PA-C ?Legrand Como "Jonni Sanger" [place], PA-C ? ?  You will receive a reminder letter in the mail two months in advance. If you don't receive a letter, please call our office to schedule the follow-up appointment. ? ?We recommend signing up for the patient portal called "MyChart".  Sign up information is provided on this After Visit Summary.  MyChart is used to connect with patients for Virtual Visits (Telemedicine).  Patients are able to view lab/test results, encounter notes, upcoming appointments, etc.  Non-urgent messages can be sent to your provider as well.   ?To learn more about what you can do with MyChart, go to  [email or web address].   ? ?Any Other Special Instructions Will Be Listed Below (If Applicable). ? ? ?AliveCor  FDA-cleared EKG at your fingertips. - [employer] ? ? ?KardiaMobile - [employer]. ?[email or web address]://store.[email or web address]/[email or web address]  ? ?FDA-cleared, clinical grade mobile EKG monitor: Jodelle Red is the most clinically-validated mobile EKG used by the world's leading cardiac care medical professionals. ? ?This may be useful in monitoring palpitations.  We do not have access to have them emailed and reviewed but will be glad to review while in the office.    ? ?  ? ? ?

## 2021-06-07 DIAGNOSIS — H2513 Age-related nuclear cataract, bilateral: Secondary | ICD-10-CM | POA: Diagnosis not present

## 2021-06-07 DIAGNOSIS — H5203 Hypermetropia, bilateral: Secondary | ICD-10-CM | POA: Diagnosis not present

## 2021-06-07 DIAGNOSIS — H3500 Unspecified background retinopathy: Secondary | ICD-10-CM | POA: Diagnosis not present

## 2021-06-15 ENCOUNTER — Encounter: Payer: Self-pay | Admitting: Cardiology

## 2021-06-15 ENCOUNTER — Encounter (HOSPITAL_COMMUNITY): Payer: Self-pay

## 2021-06-16 ENCOUNTER — Ambulatory Visit (HOSPITAL_COMMUNITY)
Admission: RE | Admit: 2021-06-16 | Discharge: 2021-06-16 | Disposition: A | Discharge status: Home / Self Care | Payer: PPO | Source: Ambulatory Visit | Attending: Physician Assistant | Admitting: Physician Assistant

## 2021-06-16 ENCOUNTER — Encounter (HOSPITAL_COMMUNITY): Payer: Self-pay | Admitting: Physician Assistant

## 2021-06-16 VITALS — BP 140/80 | HR 63 | Ht 66.0 in | Wt 192.4 lb

## 2021-06-16 DIAGNOSIS — Z7901 Long term (current) use of anticoagulants: Secondary | ICD-10-CM | POA: Insufficient documentation

## 2021-06-16 DIAGNOSIS — I484 Atypical atrial flutter: Secondary | ICD-10-CM | POA: Diagnosis not present

## 2021-06-16 DIAGNOSIS — Z8616 Personal history of COVID-19: Secondary | ICD-10-CM | POA: Insufficient documentation

## 2021-06-16 DIAGNOSIS — Z79899 Other long term (current) drug therapy: Secondary | ICD-10-CM | POA: Diagnosis not present

## 2021-06-16 DIAGNOSIS — D6869 Other thrombophilia: Secondary | ICD-10-CM | POA: Diagnosis not present

## 2021-06-16 DIAGNOSIS — I10 Essential (primary) hypertension: Secondary | ICD-10-CM | POA: Diagnosis not present

## 2021-06-16 MED ORDER — ELIQUIS 5 MG PO TABS: 5.0000 mg | ORAL_TABLET | Freq: Two times a day (BID) | ORAL | 3 refills | Status: DC

## 2021-06-16 NOTE — Progress Notes (Signed)
Primary Care Physician: [name], MD Primary Cardiologist: none Primary Electrophysiologist: Dr Quentin Ore Referring Physician: Dr Trula Ore is a 71 y.o. male with a history of HTN, HLD, and atrial flutter who presents for follow up in the [facility].  The patient was initially diagnosed with atrial flutter on 11/08/20 after presenting to his PCP with symptoms of increased dyspnea on exertion. He had COVID-19 in 08/2018 and has felt this dyspnea since. ECG at his PCP showed atrial flutter. Patient was started on Eliquis for a CHADS2VASC score of 2. He denies significant snoring or alcohol use. Patient is s/p DCCV on 12/13/20. Unfortunately, he felt he was back out of rhythm about 5 days later. He is s/p atrial flutter ablation with Dr Quentin Ore on 03/17/21.  On follow up today, patient purchased a Kardia mobile device. He did get two readings for "possible afib". One appeared to be sinus rhythm with PVCs but the other appeared similar to his ECG in atrial flutter prior to DCCV. He was asymptomatic at the time. He was back in SR within a few hours.   Today, he denies symptoms of palpitations, chest pain, orthopnea, PND, lower extremity edema, dizziness, presyncope, syncope, snoring, daytime somnolence, bleeding, or neurologic sequela. The patient is tolerating medications without difficulties and is otherwise without complaint today.    Atrial Fibrillation Risk Factors:  he does not have symptoms or diagnosis of sleep apnea. he does not have a history of rheumatic fever. he does not have a history of alcohol use. The patient does not have a history of early familial atrial fibrillation or other arrhythmias.  he has a BMI of Body mass index is 31.05 kg/m.Marland Kitchen Filed Weights   06/16/21 1433  Weight: 87.3 kg    No family history on file.   Atrial Fibrillation Management history:  Previous antiarrhythmic drugs: none Previous cardioversions:  12/13/20 Previous ablations: 03/17/21 flutter CHADS2VASC score: 2 Anticoagulation history: Eliquis   Past Medical History:  Diagnosis Date   Atrial flutter ([place])    Hypertension    Past Surgical History:  Procedure Laterality Date   A-FLUTTER ABLATION N/A 03/17/2021   Procedure: A-FLUTTER ABLATION;  Surgeon: Vickie Epley, MD;  Location: [place] [facility] LAB;  Service: Cardiovascular;  Laterality: N/A;   CARDIOVERSION N/A 12/13/2020   Procedure: CARDIOVERSION;  Surgeon: Donato Heinz, MD;  Location: [facility] ENDOSCOPY;  Service: Cardiovascular;  Laterality: N/A;   CYST EXCISION     pylonidal cyst   TONSILLECTOMY      Current Outpatient Medications  Medication Sig Dispense Refill   aspirin EC 325 MG tablet Take 325 mg by mouth every 6 (six) hours as needed (pain.).     Coenzyme Q10 100 MG capsule Take 100 mg by mouth daily at 12 noon.     losartan (COZAAR) 100 MG tablet Take 100 mg by mouth every evening.     metoprolol succinate (TOPROL-XL) 50 MG 24 hr tablet Take 50 mg by mouth in the morning.     pravastatin (PRAVACHOL) 40 MG tablet Take 40 mg by mouth every evening.     tamsulosin (FLOMAX) 0.4 MG CAPS capsule Take 0.4 mg by mouth in the morning.     ELIQUIS 5 MG TABS tablet Take 1 tablet (5 mg total) by mouth 2 (two) times daily. 60 tablet 3   pantoprazole (PROTONIX) 40 MG tablet Take 1 tablet (40 mg total) by mouth daily. 42 tablet 0   No  current facility-administered medications for this encounter.    Allergies  Allergen Reactions   Hydrochlorothiazide     Other reaction(s): hyponatremia   Telmisartan Diarrhea   Lisinopril Cough   Statins Other (See Comments)    Muscle pain    Social History   Socioeconomic History   Marital status: Married    Spouse name: Not on file   Number of children: Not on file   Years of education: Not on file   Highest education level: Not on file  Occupational History   Not on file  Tobacco Use   Smoking status: Former     Types: Cigarettes    Quit date: 19    Years since quitting: 44.4   Smokeless tobacco: Never   Tobacco comments:    Former smoker 11/12/2020  Substance and Sexual Activity   Alcohol use: Yes    Alcohol/week: 14.0 - 18.0 standard drinks    Types: 14 - 18 Glasses of wine per week    Comment: 2-3 glasses of wine daily 06/16/21   Drug use: Never   Sexual activity: Not on file  Other Topics Concern   Not on file  Social History Narrative   Not on file   Social Determinants of Health   Financial Resource Strain: Not on file  Food Insecurity: Not on file  Transportation Needs: Not on file  Physical Activity: Not on file  Stress: Not on file  Social Connections: Not on file  Intimate Partner Violence: Not on file     ROS- All systems are reviewed and negative except as per the HPI above.  Physical Exam: Vitals:   06/16/21 1433  BP: 140/80  Pulse: 63  Weight: 87.3 kg  Height: '5\' 6"'$  (1.676 m)     GEN- The patient is a well appearing obese male, alert and oriented x 3 today.   HEENT-head normocephalic, atraumatic, sclera clear, conjunctiva pink, hearing intact, trachea midline. Lungs- Clear to ausculation bilaterally, normal work of breathing Heart- Regular rate and rhythm, occasional ectopic beat, no murmurs, rubs or gallops  GI- soft, NT, ND, + BS Extremities- no clubbing, cyanosis, or edema MS- no significant deformity or atrophy Skin- no rash or lesion Psych- euthymic mood, full affect Neuro- strength and sensation are intact   Wt Readings from Last 3 Encounters:  06/16/21 87.3 kg  04/20/21 87.2 kg  03/17/21 83.9 kg    EKG today demonstrates  SR, PVC Vent. rate 63 BPM PR interval 182 ms QRS duration 86 ms QT/QTcB 390/399 ms  Echo 11/15/20 demonstrated  1. Left ventricular ejection fraction, by estimation, is 60 to 65%. The  left ventricle has normal function. The left ventricle has no regional  wall motion abnormalities. Left ventricular diastolic  function could not  be evaluated.   2. Right ventricular systolic function is normal. The right ventricular  size is normal. There is normal pulmonary artery systolic pressure.   3. Left atrial size was mildly dilated.   4. Right atrial size was mildly dilated.   5. The mitral valve is normal in structure. Trivial mitral valve  regurgitation.   6. The aortic valve has an indeterminant number of cusps. There is  moderate calcification of the aortic valve. There is mild thickening of  the aortic valve. Aortic valve regurgitation is not visualized. Mild to  moderate aortic valve sclerosis/calcification is present, without any evidence of aortic stenosis.   7. The inferior vena cava is normal in size with <50% respiratory  variability, suggesting  right atrial pressure of 8 mmHg.   Comparison(s): No prior Echocardiogram.   Conclusion(s)/Recommendation(s): Otherwise normal echocardiogram, with minor abnormalities described in the report. In atrial flutter throughout study, diastology cannot be assessed.   Epic records are reviewed at length today  CHA2DS2-VASc Score = 2  The patient's score is based upon: CHF History: 0 HTN History: 1 Diabetes History: 0 Stroke History: 0 Vascular Disease History: 0 Age Score: 1 Gender Score: 0       ASSESSMENT AND PLAN: 1. Typical atrial flutter/atypical atrial flutter The patient's CHA2DS2-VASc score is 2, indicating a 2.2% annual risk of stroke. S/p flutter ablation 03/17/21 Kardia mobile strips appear like atypical atrial flutter. Given the brevity of the episode and paucity of symptoms, will continue present therapy for now.  Resume Eliquis 5 mg BID Continue Toprol 50 mg daily Kardia mobile for home monitoring.   2. Secondary Hypercoagulable State (ICD10:  D68.69) The patient is at significant risk for stroke/thromboembolism based upon his CHA2DS2-VASc Score of 2.   Resume  Apixaban (Eliquis).   3. HTN Stable, no changes today.   Follow  up with Dr Quentin Ore as scheduled.    [facility] [address] [place], [place] [address] [phone] 06/16/2021 2:57 PM

## 2021-07-21 ENCOUNTER — Ambulatory Visit: Payer: PPO | Admitting: Cardiology

## 2021-07-21 ENCOUNTER — Encounter: Payer: Self-pay | Admitting: Cardiology

## 2021-07-21 VITALS — BP 128/80 | HR 70 | Ht 66.0 in | Wt 194.0 lb

## 2021-07-21 DIAGNOSIS — I4892 Unspecified atrial flutter: Secondary | ICD-10-CM | POA: Diagnosis not present

## 2021-07-21 MED ORDER — ASPIRIN 81 MG PO TBEC: 81.0000 mg | DELAYED_RELEASE_TABLET | Freq: Every day | ORAL | 3 refills | Status: AC

## 2021-07-21 NOTE — Addendum Note (Signed)
Addended by: Darrell Jewel on: 07/21/2021 10:15 AM   Modules accepted: Orders

## 2021-07-21 NOTE — Patient Instructions (Addendum)
Medication Instructions:  Stop Eliquis Stop Aspirin 325 mg Start Aspirin 81 mg daily  Your physician recommends that you continue on your current medications as directed. Please refer to the Current Medication list given to you today. *If you need a refill on your cardiac medications before your next appointment, please call your pharmacy*  Lab Work: None. If you have labs (blood work) drawn today and your tests are completely normal, you will receive your results only by: [place] (if you have MyChart) OR A paper copy in the mail If you have any lab test that is abnormal or we need to change your treatment, we will call you to review the results.  Testing/Procedures: None.  Follow-Up: At [facility], you and your health needs are our priority.  As part of our continuing mission to provide you with exceptional heart care, we have created designated Provider Care Teams.  These Care Teams include your primary Cardiologist (physician) and Advanced Practice Providers (APPs -  Physician Assistants and Nurse Practitioners) who all work together to provide you with the care you need, when you need it.  Your physician wants you to follow-up in: 12 months with  one of the following Advanced Practice Providers on your designated Care Team:    Tommye Standard, PA-C Legrand Como "Jonni Sanger" [place], [place]   You will receive a reminder letter in the mail two months in advance. If you don't receive a letter, please call our office to schedule the follow-up appointment.  We recommend signing up for the patient portal called "MyChart".  Sign up information is provided on this After Visit Summary.  MyChart is used to connect with patients for Virtual Visits (Telemedicine).  Patients are able to view lab/test results, encounter notes, upcoming appointments, etc.  Non-urgent messages can be sent to your provider as well.   To learn more about what you can do with MyChart, go to [email or web address].    Any  Other Special Instructions Will Be Listed Below (If Applicable).

## 2021-07-21 NOTE — Progress Notes (Signed)
Electrophysiology Office Follow up Visit Note:    Date:  07/21/2021   ID:  Zachary Lowe, DOB 1950-11-25, MRN [id number]  PCP:  [name], MD  [facility] Cardiologist:  None  [facility] Electrophysiologist:  Vickie Epley, MD    Interval History:    Zachary Lowe is a 71 y.o. male who presents for a follow up visit. They were last seen in clinic 04/20/2021.  Since their last appointment, they sent Kardia tracings via [place] on 06/15/21. Adline Peals, PA reviewed them personally which appeared to show atrial fibrillation. He was scheduled for a visit the next day. It was felt his Jodelle Red mobile strips appeared like atypical atrial flutter. His EKG at his visit with Adline Peals, PA showed SR at 63 bpm with PVC. Given the brevity of his episode and paucity of symptoms, his present therapy was continued. Eliquis 5 mg BID was resumed for chads2vasc score of 2.  Since then he sent another tracing labeled as "possible Afib" on 07/18/21, which was personally reviewed by me and showed NSR with PVCs.  Today, he reports that he has increased cardiac awareness and is feeling some chest discomfort. He describes it as similar to heartburn in his left chest. This discomfort does not occur with exertion. Generally this will stay for a time before resolving spontaneously; the discomfort also seems to correlate with his PVCs as well. After discussion with Adline Peals, PA, he believes he has been able to detect more PVC's while checking his Kardia device.  He confirms having hypertension for years.  Currently he only takes Aspirin if he needs it.  They deny any shortness of breath, or peripheral edema. No lightheadedness, headaches, syncope, orthopnea, or PND.      Past Medical History:  Diagnosis Date   Atrial flutter ([place])    Hypertension     Past Surgical History:  Procedure Laterality Date   A-FLUTTER ABLATION N/A 03/17/2021   Procedure: A-FLUTTER ABLATION;  Surgeon:  Vickie Epley, MD;  Location: [place] [facility];  Service: Cardiovascular;  Laterality: N/A;   CARDIOVERSION N/A 12/13/2020   Procedure: CARDIOVERSION;  Surgeon: Donato Heinz, MD;  Location: [facility] ENDOSCOPY;  Service: Cardiovascular;  Laterality: N/A;   CYST EXCISION     pylonidal cyst   TONSILLECTOMY      Current Medications: Current Meds  Medication Sig   aspirin EC 325 MG tablet Take 325 mg by mouth every 6 (six) hours as needed (pain.).   Coenzyme Q10 100 MG capsule Take 100 mg by mouth daily at 12 noon.   ELIQUIS 5 MG TABS tablet Take 1 tablet (5 mg total) by mouth 2 (two) times daily.   losartan (COZAAR) 100 MG tablet Take 100 mg by mouth every evening.   metoprolol succinate (TOPROL-XL) 50 MG 24 hr tablet Take 50 mg by mouth in the morning.   pravastatin (PRAVACHOL) 40 MG tablet Take 40 mg by mouth every evening.   tamsulosin (FLOMAX) 0.4 MG CAPS capsule Take 0.4 mg by mouth in the morning.     Allergies:   Hydrochlorothiazide, Telmisartan, Lisinopril, and Statins   Social History   Socioeconomic History   Marital status: Married    Spouse name: Not on file   Number of children: Not on file   Years of education: Not on file   Highest education level: Not on file  Occupational History   Not on file  Tobacco Use   Smoking status: Former    Types:  Cigarettes    Quit date: 58    Years since quitting: 44.5   Smokeless tobacco: Never   Tobacco comments:    Former smoker 11/12/2020  Substance and Sexual Activity   Alcohol use: Yes    Alcohol/week: 14.0 - 18.0 standard drinks of alcohol    Types: 14 - 18 Glasses of wine per week    Comment: 2-3 glasses of wine daily 06/16/21   Drug use: Never   Sexual activity: Not on file  Other Topics Concern   Not on file  Social History Narrative   Not on file   Social Determinants of Health   Financial Resource Strain: Not on file  Food Insecurity: Not on file  Transportation Needs: Not on file  Physical  Activity: Not on file  Stress: Not on file  Social Connections: Not on file     Family History: The patient's family history is not on file.  ROS:   Please see the history of present illness.    (+) Chest discomfort (+) Palpitations All other systems reviewed and are negative.  EKGs/Labs/Other Studies Reviewed:    The following studies were reviewed today:  03/17/2021  Atrial Flutter Ablation:  CONCLUSIONS:   1. Isthmus-dependent counter clockwise right atrial flutter.   2. Successful radiofrequency ablation of atrial flutter along the cavotricuspid isthmus with complete bidirectional isthmus block achieved.   3. Intracardiac echo demonstrated trivial pericardial effusion  4. No early apparent complications.   02/2021  Monitor: HR 35-151bpm, average 86bpm. Rare ventricular ectopy. 100% atrial flutter.  11/15/2020  Echocardiogram:  1. Left ventricular ejection fraction, by estimation, is 60 to 65%. The  left ventricle has normal function. The left ventricle has no regional  wall motion abnormalities. Left ventricular diastolic function could not  be evaluated.   2. Right ventricular systolic function is normal. The right ventricular  size is normal. There is normal pulmonary artery systolic pressure.   3. Left atrial size was mildly dilated.   4. Right atrial size was mildly dilated.   5. The mitral valve is normal in structure. Trivial mitral valve  regurgitation.   6. The aortic valve has an indeterminant number of cusps. There is  moderate calcification of the aortic valve. There is mild thickening of  the aortic valve. Aortic valve regurgitation is not visualized. Mild to  moderate aortic valve  sclerosis/calcification is present, without any evidence of aortic  stenosis.   7. The inferior vena cava is normal in size with <50% respiratory  variability, suggesting right atrial pressure of 8 mmHg.   Comparison(s): No prior Echocardiogram.    Conclusion(s)/Recommendation(s): Otherwise normal echocardiogram, with  minor abnormalities described in the report. In atrial flutter throughout  study, diastology cannot be assessed.    EKG:  EKG is personally reviewed.  07/21/2021: Sinus rhythm  Recent Labs: 02/24/2021: BUN 15; Creatinine, Ser 0.97; Hemoglobin 15.5; Platelets 152; Potassium 4.9; Sodium 138   Recent Lipid Panel No results found for: "CHOL", "TRIG", "HDL", "CHOLHDL", "VLDL", "LDLCALC", "LDLDIRECT"  Physical Exam:    VS:  BP 128/80 (BP Location: Left Arm, Patient Position: Sitting, Cuff Size: Normal)   Pulse 70   Ht '5\' 6"'$  (1.676 m)   Wt 194 lb (88 kg)   BMI 31.31 kg/m     Wt Readings from Last 3 Encounters:  07/21/21 194 lb (88 kg)  06/16/21 192 lb 6.4 oz (87.3 kg)  04/20/21 192 lb 3.2 oz (87.2 kg)     GEN: Well nourished, well developed  in no acute distress HEENT: Normal NECK: No JVD; No carotid bruits LYMPHATICS: No lymphadenopathy CARDIAC: RRR, no murmurs, rubs, gallops RESPIRATORY:  Clear to auscultation without rales, wheezing or rhonchi  ABDOMEN: Soft, non-tender, non-distended MUSCULOSKELETAL:  No edema; No deformity  SKIN: Warm and dry NEUROLOGIC:  Alert and oriented x 3 PSYCHIATRIC:  Normal affect        ASSESSMENT:    No diagnosis found. PLAN:    In order of problems listed above:  #Atrial flutter Doing well after his flutter ablation on March 17, 2021.  No recurrence.  I have reviewed his cardia tracings which show sinus rhythm with PVCs.  He also has a cardia tracing that shows significant artifact which is obscuring P waves.  I have recommended that he stop Eliquis and start aspirin 81 mg by mouth once daily.  He will continue to monitor his heart rhythm using a cardia monitor.  #Hypertension Controlled.  Continue current medication regimen.  Recommend checking blood pressures at home 1-2 times per week and bring these recordings to the primary care physician for further  medication regimen titration.  Follow-up in 1 year or sooner as needed.   Medication Adjustments/Labs and Tests Ordered: Current medicines are reviewed at length with the patient today.  Concerns regarding medicines are outlined above.  No orders of the defined types were placed in this encounter.  No orders of the defined types were placed in this encounter.   I,Mathew Stumpf,acting as a Education administrator for Vickie Epley, MD.,have documented all relevant documentation on the behalf of Vickie Epley, MD,as directed by  Vickie Epley, MD while in the presence of Vickie Epley, MD.  I, Vickie Epley, MD, have reviewed all documentation for this visit. The documentation on 07/21/21 for the exam, diagnosis, procedures, and orders are all accurate and complete.   Signed, Lars Mage, MD, [facility] 07/21/2021 10:04 AM    Electrophysiology [facility]

## 2021-08-10 DIAGNOSIS — I10 Essential (primary) hypertension: Secondary | ICD-10-CM | POA: Diagnosis not present

## 2021-08-10 DIAGNOSIS — E222 Syndrome of inappropriate secretion of antidiuretic hormone: Secondary | ICD-10-CM | POA: Diagnosis not present

## 2021-08-10 DIAGNOSIS — I483 Typical atrial flutter: Secondary | ICD-10-CM | POA: Diagnosis not present

## 2022-05-30 ENCOUNTER — Ambulatory Visit: Payer: PPO | Admitting: Family Medicine

## 2022-06-29 NOTE — Progress Notes (Unsigned)
[name] [address] [place] [address] Phone: [phone] Subjective:   Zachary Lowe, am serving as a scribe for Dr. Antoine Lowe.  I'm seeing this patient by the request  of:  Lowe, Zachary Maki, MD (Inactive)  CC: Left trigger finger  [id number]  Zachary Lowe is a 72 y.o. male coming in with complaint of trigger finger, B 4th. Patient states that he has had chronic issues especially when trying to use his hands to grip. Tried bracing at night but his finger would get very stiff and he was unable to bend finger in the morning.      Past Medical History:  Diagnosis Date   Atrial flutter ([facility])    Hypertension    Past Surgical History:  Procedure Laterality Date   A-FLUTTER ABLATION N/A 03/17/2021   Procedure: A-FLUTTER ABLATION;  Surgeon: Lanier Prude, MD;  Location: [facility] INVASIVE CV LAB;  Service: Cardiovascular;  Laterality: N/A;   CARDIOVERSION N/A 12/13/2020   Procedure: CARDIOVERSION;  Surgeon: Little Ishikawa, MD;  Location: [facility] ENDOSCOPY;  Service: Cardiovascular;  Laterality: N/A;   CYST EXCISION     pylonidal cyst   TONSILLECTOMY     Social History   Socioeconomic History   Marital status: Married    Spouse name: Not on file   Number of children: Not on file   Years of education: Not on file   Highest education level: Not on file  Occupational History   Not on file  Tobacco Use   Smoking status: Former    Types: Cigarettes    Quit date: 1979    Years since quitting: 45.4   Smokeless tobacco: Never   Tobacco comments:    Former smoker 11/12/2020  Substance and Sexual Activity   Alcohol use: Yes    Alcohol/week: 14.0 - 18.0 standard drinks of alcohol    Types: 14 - 18 Glasses of wine per week    Comment: 2-3 glasses of wine daily 06/16/21   Drug use: Never   Sexual activity: Not on file  Other Topics Concern   Not on file  Social History Narrative   Not on file   Social Determinants of  Health   Financial Resource Strain: Not on file  Food Insecurity: Not on file  Transportation Needs: Not on file  Physical Activity: Not on file  Stress: Not on file  Social Connections: Not on file   Allergies  Allergen Reactions   Hydrochlorothiazide     Other reaction(s): hyponatremia   Telmisartan Diarrhea   Lisinopril Cough   Statins Other (See Comments)    Muscle pain   No family history on file.   Current Outpatient Medications (Cardiovascular):    losartan (COZAAR) 100 MG tablet, Take 100 mg by mouth every evening.   metoprolol succinate (TOPROL-XL) 50 MG 24 hr tablet, Take 50 mg by mouth in the morning.   pravastatin (PRAVACHOL) 40 MG tablet, Take 40 mg by mouth every evening.   Current Outpatient Medications (Analgesics):    aspirin EC 81 MG tablet, Take 1 tablet (81 mg total) by mouth daily. Swallow whole.   Current Outpatient Medications (Other):    Coenzyme Q10 100 MG capsule, Take 100 mg by mouth daily at 12 noon.   tamsulosin (FLOMAX) 0.4 MG CAPS capsule, Take 0.4 mg by mouth in the morning.   Reviewed prior external information including notes and imaging from  primary care provider As well as notes that were available from  care everywhere and other healthcare systems.  Past medical history, social, surgical and family history all reviewed in electronic medical record.  No pertanent information unless stated regarding to the chief complaint.   Review of Systems:  No headache, visual changes, nausea, vomiting, diarrhea, constipation, dizziness, abdominal pain, skin rash, fevers, chills, night sweats, weight loss, swollen lymph nodes, body aches, joint swelling, chest pain, shortness of breath, mood changes. POSITIVE muscle aches  Objective  Blood pressure 130/72, pulse 75, height 5\' 6"  (1.676 m), weight 178 lb (80.7 kg), SpO2 98 %.   General: No apparent distress alert and oriented x3 mood and affect normal, dressed appropriately.  HEENT: Pupils equal,  extraocular movements intact  Respiratory: Patient's speak in full sentences and does not appear short of breath  Cardiovascular: No lower extremity edema, non tender, no erythema  Bilateral hands do have trigger nodules noted at the apex 1 holding the PIP joints bilaterally.  Left worse than right.  Procedure: Real-time Ultrasound Guided Injection of left flexor tendon sheath of the ring finger Device: GE Logiq Q7 Ultrasound guided injection is preferred based studies that show increased duration, increased effect, greater accuracy, decreased procedural pain, increased response rate, and decreased cost with ultrasound guided versus blind injection.  Verbal informed consent obtained.  Time-out conducted.  Noted no overlying erythema, induration, or other signs of local infection.  Skin prepped in a sterile fashion.  Local anesthesia: Topical Ethyl chloride.  With sterile technique and under real time ultrasound guidance: With a 25-gauge half inch needle injected with 0.5 cc of 0.5% Marcaine and 0.5 cc of Kenalog 40 mg/mL Completed without difficulty  Pain immediately improved suggesting accurate placement of the medication.  Advised to call if fevers/chills, erythema, induration, drainage, or persistent bleeding.  Impression: Technically successful ultrasound guided injection.  Procedure: Real-time Ultrasound Guided Injection of right flexor tendon sheath of the ring finger Device: GE Logiq Q7 Ultrasound guided injection is preferred based studies that show increased duration, increased effect, greater accuracy, decreased procedural pain, increased response rate, and decreased cost with ultrasound guided versus blind injection.  Verbal informed consent obtained.  Time-out conducted.  Noted no overlying erythema, induration, or other signs of local infection.  Skin prepped in a sterile fashion.  Local anesthesia: Topical Ethyl chloride.  With sterile technique and under real time ultrasound  guidance: With a 25-gauge half inch needle injected with 0.5 cc of 0.5% Marcaine and 0.5 cc of Kenalog 40 mg/mL Completed without difficulty  Pain immediately improved suggesting accurate placement of the medication.  Advised to call if fevers/chills, erythema, induration, drainage, or persistent bleeding.  Impression: Technically successful ultrasound guided injection.    Impression and Recommendations:     The above documentation has been reviewed and is accurate and complete Judi Saa, DO

## 2022-07-04 ENCOUNTER — Ambulatory Visit: Payer: PPO | Admitting: Family Medicine

## 2022-07-04 ENCOUNTER — Other Ambulatory Visit: Payer: Self-pay

## 2022-07-04 VITALS — BP 130/72 | HR 75 | Ht 66.0 in | Wt 178.0 lb

## 2022-07-04 DIAGNOSIS — M65341 Trigger finger, right ring finger: Secondary | ICD-10-CM | POA: Diagnosis not present

## 2022-07-04 DIAGNOSIS — M79642 Pain in left hand: Secondary | ICD-10-CM | POA: Diagnosis not present

## 2022-07-04 DIAGNOSIS — M65342 Trigger finger, left ring finger: Secondary | ICD-10-CM | POA: Diagnosis not present

## 2022-07-04 NOTE — Assessment & Plan Note (Signed)
Injected, brace, tape, rtc in 3 months

## 2022-07-04 NOTE — Patient Instructions (Addendum)
Injection in fingers today See you again in 2 months

## 2022-07-04 NOTE — Assessment & Plan Note (Signed)
Injected finger today at A1 pully.  Discussed bracing, prognosis and likley progression  RTC in 3 months

## 2022-07-04 NOTE — Progress Notes (Signed)
  Electrophysiology Office Note:   Date:  07/05/2022  ID:  Zachary Lowe, DOB [birth year], MRN [id number]  Primary Cardiologist: None Electrophysiologist: Lanier Prude, MD   History of Present Illness:   Zachary Lowe is a 72 y.o. male with h/o Atrial flutter s/p ablation 03/17/2021 and HTN seen today for routine electrophysiology followup.   Since last being seen in our clinic the patient reports doing well. He occasionally has palpitations associated with HRs in 110-120s on his Zachary Lowe that gradually come on and off. Otherwise,  he denies chest pain, dyspnea, PND, orthopnea, nausea, vomiting, dizziness, syncope, edema, weight gain, or early satiety.   Review of systems complete and found to be negative unless listed in HPI.   Studies Reviewed:    EKG is ordered today. Personal review shows sinus bradycardia at 47 bpm   Physical Exam:   VS:  BP (!) 150/90   Pulse (!) 47   Ht 5\' 6"  (1.676 m)   Wt 176 lb 3.2 oz (79.9 kg)   SpO2 99%   BMI 28.44 kg/m    Wt Readings from Last 3 Encounters:  07/05/22 176 lb 3.2 oz (79.9 kg)  07/04/22 178 lb (80.7 kg)  07/21/21 194 lb (88 kg)     GEN: Well nourished, well developed in no acute distress NECK: No JVD; No carotid bruits CARDIAC: Regular rate and rhythm, no murmurs, rubs, gallops RESPIRATORY:  Clear to auscultation without rales, wheezing or rhonchi  ABDOMEN: Soft, non-tender, non-distended EXTREMITIES:  No edema; No deformity   ASSESSMENT AND PLAN:    Atrial flutter Stable s/p ablation 02/2021 No recurrence Has had Kardia tracings that have shown "irregularity" due to ectopy He remains off Eliquis. Monitoring HRs and will need to resume If has fib  HTN Stable on current regimen   Follow up with Dr. Lalla Brothers in 12 months  Signed, Graciella Freer, PA-C

## 2022-07-05 ENCOUNTER — Encounter: Payer: Self-pay | Admitting: Family Medicine

## 2022-07-05 ENCOUNTER — Ambulatory Visit: Payer: PPO | Attending: Student | Admitting: Student

## 2022-07-05 ENCOUNTER — Encounter: Payer: Self-pay | Admitting: Student

## 2022-07-05 VITALS — BP 150/90 | HR 47 | Ht 66.0 in | Wt 176.2 lb

## 2022-07-05 DIAGNOSIS — I10 Essential (primary) hypertension: Secondary | ICD-10-CM

## 2022-07-05 DIAGNOSIS — I4892 Unspecified atrial flutter: Secondary | ICD-10-CM | POA: Diagnosis not present

## 2022-07-05 NOTE — Patient Instructions (Addendum)
Medication Instructions:  Your physician recommends that you continue on your current medications as directed. Please refer to the Current Medication list given to you today.  *If you need a refill on your cardiac medications before your next appointment, please call your pharmacy*  Lab Work: None ordered If you have labs (blood work) drawn today and your tests are completely normal, you will receive your results only by: MyChart Message (if you have MyChart) OR A paper copy in the mail If you have any lab test that is abnormal or we need to change your treatment, we will call you to review the results.  Follow-Up: At [facility], you and your health needs are our priority.  As part of our continuing mission to provide you with exceptional heart care, we have created designated Provider Care Teams.  These Care Teams include your primary Cardiologist (physician) and Advanced Practice Providers (APPs -  Physician Assistants and Nurse Practitioners) who all work together to provide you with the care you need, when you need it.  Your next appointment:   1 year  Provider:   Cameron Lambert, MD  

## 2022-09-05 ENCOUNTER — Ambulatory Visit: Payer: PPO | Admitting: Family Medicine

## 2023-04-09 DIAGNOSIS — Z1331 Encounter for screening for depression: Secondary | ICD-10-CM | POA: Diagnosis not present

## 2023-04-09 DIAGNOSIS — I10 Essential (primary) hypertension: Secondary | ICD-10-CM | POA: Diagnosis not present

## 2023-04-09 DIAGNOSIS — Z1211 Encounter for screening for malignant neoplasm of colon: Secondary | ICD-10-CM | POA: Diagnosis not present

## 2023-04-09 DIAGNOSIS — Z79899 Other long term (current) drug therapy: Secondary | ICD-10-CM | POA: Diagnosis not present

## 2023-04-09 DIAGNOSIS — Z0000 Encounter for general adult medical examination without abnormal findings: Secondary | ICD-10-CM | POA: Diagnosis not present

## 2023-04-09 DIAGNOSIS — E7800 Pure hypercholesterolemia, unspecified: Secondary | ICD-10-CM | POA: Diagnosis not present

## 2023-04-09 DIAGNOSIS — I483 Typical atrial flutter: Secondary | ICD-10-CM | POA: Diagnosis not present

## 2023-04-09 DIAGNOSIS — D6869 Other thrombophilia: Secondary | ICD-10-CM | POA: Diagnosis not present

## 2023-04-09 DIAGNOSIS — E222 Syndrome of inappropriate secretion of antidiuretic hormone: Secondary | ICD-10-CM | POA: Diagnosis not present

## 2023-04-30 DIAGNOSIS — I10 Essential (primary) hypertension: Secondary | ICD-10-CM | POA: Diagnosis not present

## 2023-04-30 DIAGNOSIS — D696 Thrombocytopenia, unspecified: Secondary | ICD-10-CM | POA: Diagnosis not present

## 2023-07-25 DIAGNOSIS — H2513 Age-related nuclear cataract, bilateral: Secondary | ICD-10-CM | POA: Diagnosis not present

## 2023-07-25 DIAGNOSIS — H5203 Hypermetropia, bilateral: Secondary | ICD-10-CM | POA: Diagnosis not present

## 2023-10-10 DIAGNOSIS — I10 Essential (primary) hypertension: Secondary | ICD-10-CM | POA: Diagnosis not present

## 2023-10-10 DIAGNOSIS — E785 Hyperlipidemia, unspecified: Secondary | ICD-10-CM | POA: Diagnosis not present

## 2023-10-10 DIAGNOSIS — I483 Typical atrial flutter: Secondary | ICD-10-CM | POA: Diagnosis not present

## 2023-10-10 DIAGNOSIS — R972 Elevated prostate specific antigen [PSA]: Secondary | ICD-10-CM | POA: Diagnosis not present

## 2023-10-10 DIAGNOSIS — E222 Syndrome of inappropriate secretion of antidiuretic hormone: Secondary | ICD-10-CM | POA: Diagnosis not present

## 2023-10-10 DIAGNOSIS — E7800 Pure hypercholesterolemia, unspecified: Secondary | ICD-10-CM | POA: Diagnosis not present

## 2023-12-25 DIAGNOSIS — D3612 Benign neoplasm of peripheral nerves and autonomic nervous system, upper limb, including shoulder: Secondary | ICD-10-CM | POA: Diagnosis not present

## 2023-12-25 DIAGNOSIS — L820 Inflamed seborrheic keratosis: Secondary | ICD-10-CM | POA: Diagnosis not present

## 2023-12-25 DIAGNOSIS — L400 Psoriasis vulgaris: Secondary | ICD-10-CM | POA: Diagnosis not present

## 2023-12-25 DIAGNOSIS — L738 Other specified follicular disorders: Secondary | ICD-10-CM | POA: Diagnosis not present

## 2023-12-25 DIAGNOSIS — L918 Other hypertrophic disorders of the skin: Secondary | ICD-10-CM | POA: Diagnosis not present

## 2023-12-25 DIAGNOSIS — L720 Epidermal cyst: Secondary | ICD-10-CM | POA: Diagnosis not present

## 2023-12-25 DIAGNOSIS — L570 Actinic keratosis: Secondary | ICD-10-CM | POA: Diagnosis not present

## 2023-12-25 DIAGNOSIS — D1801 Hemangioma of skin and subcutaneous tissue: Secondary | ICD-10-CM | POA: Diagnosis not present

## 2023-12-25 DIAGNOSIS — D224 Melanocytic nevi of scalp and neck: Secondary | ICD-10-CM | POA: Diagnosis not present

## 2023-12-25 DIAGNOSIS — L821 Other seborrheic keratosis: Secondary | ICD-10-CM | POA: Diagnosis not present

## 2023-12-25 DIAGNOSIS — D2371 Other benign neoplasm of skin of right lower limb, including hip: Secondary | ICD-10-CM | POA: Diagnosis not present

## 2024-04-09 ENCOUNTER — Ambulatory Visit: Admitting: Student
